# Patient Record
Sex: Male | Born: 1955 | Race: White | Hispanic: No | Marital: Married | Smoking: Never smoker
Health system: Southern US, Community
[De-identification: ages and names within clinical notes are randomized; demographics above are authoritative.]

## PROBLEM LIST (undated history)

## (undated) DIAGNOSIS — I10 Essential (primary) hypertension: Secondary | ICD-10-CM

## (undated) DIAGNOSIS — M1990 Unspecified osteoarthritis, unspecified site: Secondary | ICD-10-CM

## (undated) DIAGNOSIS — C801 Malignant (primary) neoplasm, unspecified: Secondary | ICD-10-CM

## (undated) DIAGNOSIS — Z9889 Other specified postprocedural states: Secondary | ICD-10-CM

## (undated) DIAGNOSIS — R112 Nausea with vomiting, unspecified: Secondary | ICD-10-CM

## (undated) HISTORY — PX: ROTATOR CUFF REPAIR: SHX139

## (undated) HISTORY — PX: OTHER SURGICAL HISTORY: SHX169

## (undated) HISTORY — PX: SEPTOPLASTY: SUR1290

---

## 2002-08-07 ENCOUNTER — Encounter: Payer: Self-pay | Admitting: Orthopedic Surgery

## 2002-08-07 ENCOUNTER — Ambulatory Visit (HOSPITAL_COMMUNITY): Admission: RE | Admit: 2002-08-07 | Discharge: 2002-08-07 | Payer: Self-pay | Admitting: Orthopedic Surgery

## 2002-09-19 ENCOUNTER — Ambulatory Visit (HOSPITAL_BASED_OUTPATIENT_CLINIC_OR_DEPARTMENT_OTHER): Admission: RE | Admit: 2002-09-19 | Discharge: 2002-09-20 | Payer: Self-pay | Admitting: Orthopedic Surgery

## 2004-03-04 ENCOUNTER — Ambulatory Visit (HOSPITAL_COMMUNITY): Admission: RE | Admit: 2004-03-04 | Discharge: 2004-03-04 | Payer: Self-pay | Admitting: Otolaryngology

## 2004-03-04 ENCOUNTER — Encounter (INDEPENDENT_AMBULATORY_CARE_PROVIDER_SITE_OTHER): Payer: Self-pay | Admitting: *Deleted

## 2004-03-04 ENCOUNTER — Ambulatory Visit (HOSPITAL_BASED_OUTPATIENT_CLINIC_OR_DEPARTMENT_OTHER): Admission: RE | Admit: 2004-03-04 | Discharge: 2004-03-04 | Payer: Self-pay | Admitting: Otolaryngology

## 2004-04-22 ENCOUNTER — Observation Stay (HOSPITAL_COMMUNITY): Admission: RE | Admit: 2004-04-22 | Discharge: 2004-04-23 | Payer: Self-pay | Admitting: Specialist

## 2005-07-11 ENCOUNTER — Ambulatory Visit (HOSPITAL_COMMUNITY): Admission: RE | Admit: 2005-07-11 | Discharge: 2005-07-11 | Payer: Self-pay | Admitting: Internal Medicine

## 2006-05-03 ENCOUNTER — Ambulatory Visit (HOSPITAL_COMMUNITY): Admission: RE | Admit: 2006-05-03 | Discharge: 2006-05-03 | Payer: Self-pay | Admitting: Specialist

## 2007-06-26 ENCOUNTER — Ambulatory Visit: Payer: Self-pay | Admitting: Cardiology

## 2007-06-26 ENCOUNTER — Ambulatory Visit (HOSPITAL_COMMUNITY): Admission: RE | Admit: 2007-06-26 | Discharge: 2007-06-26 | Payer: Self-pay | Admitting: Internal Medicine

## 2007-06-29 ENCOUNTER — Ambulatory Visit (HOSPITAL_COMMUNITY): Admission: RE | Admit: 2007-06-29 | Discharge: 2007-06-29 | Payer: Self-pay | Admitting: Internal Medicine

## 2007-09-12 IMAGING — CR DG CHEST 2V
2 series · 2 of 2 positions shown · non-contrast
Comparison: None.

CLINICAL DATA: Three week history of chest pain and shortness of breath.

CHEST - 2 VIEW  07/11/2005:

[view not recorded (1 of 2)]
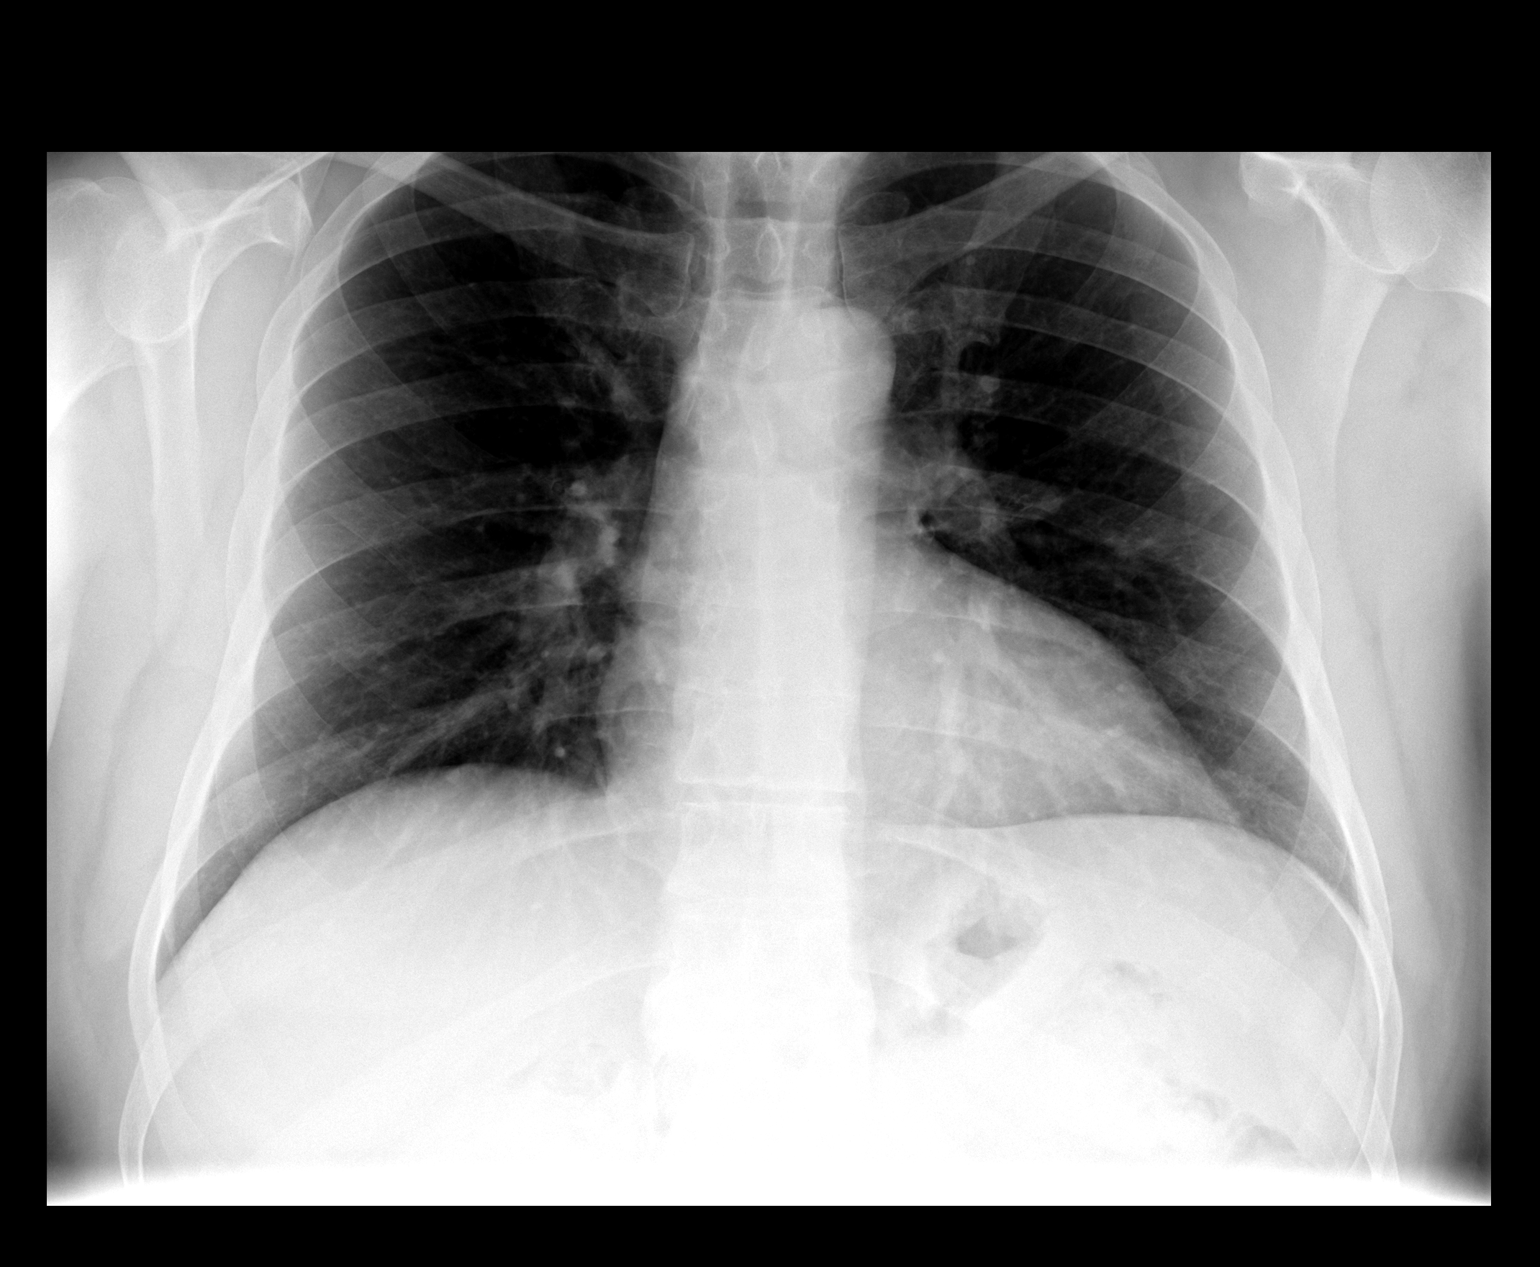

[view not recorded (2 of 2)]
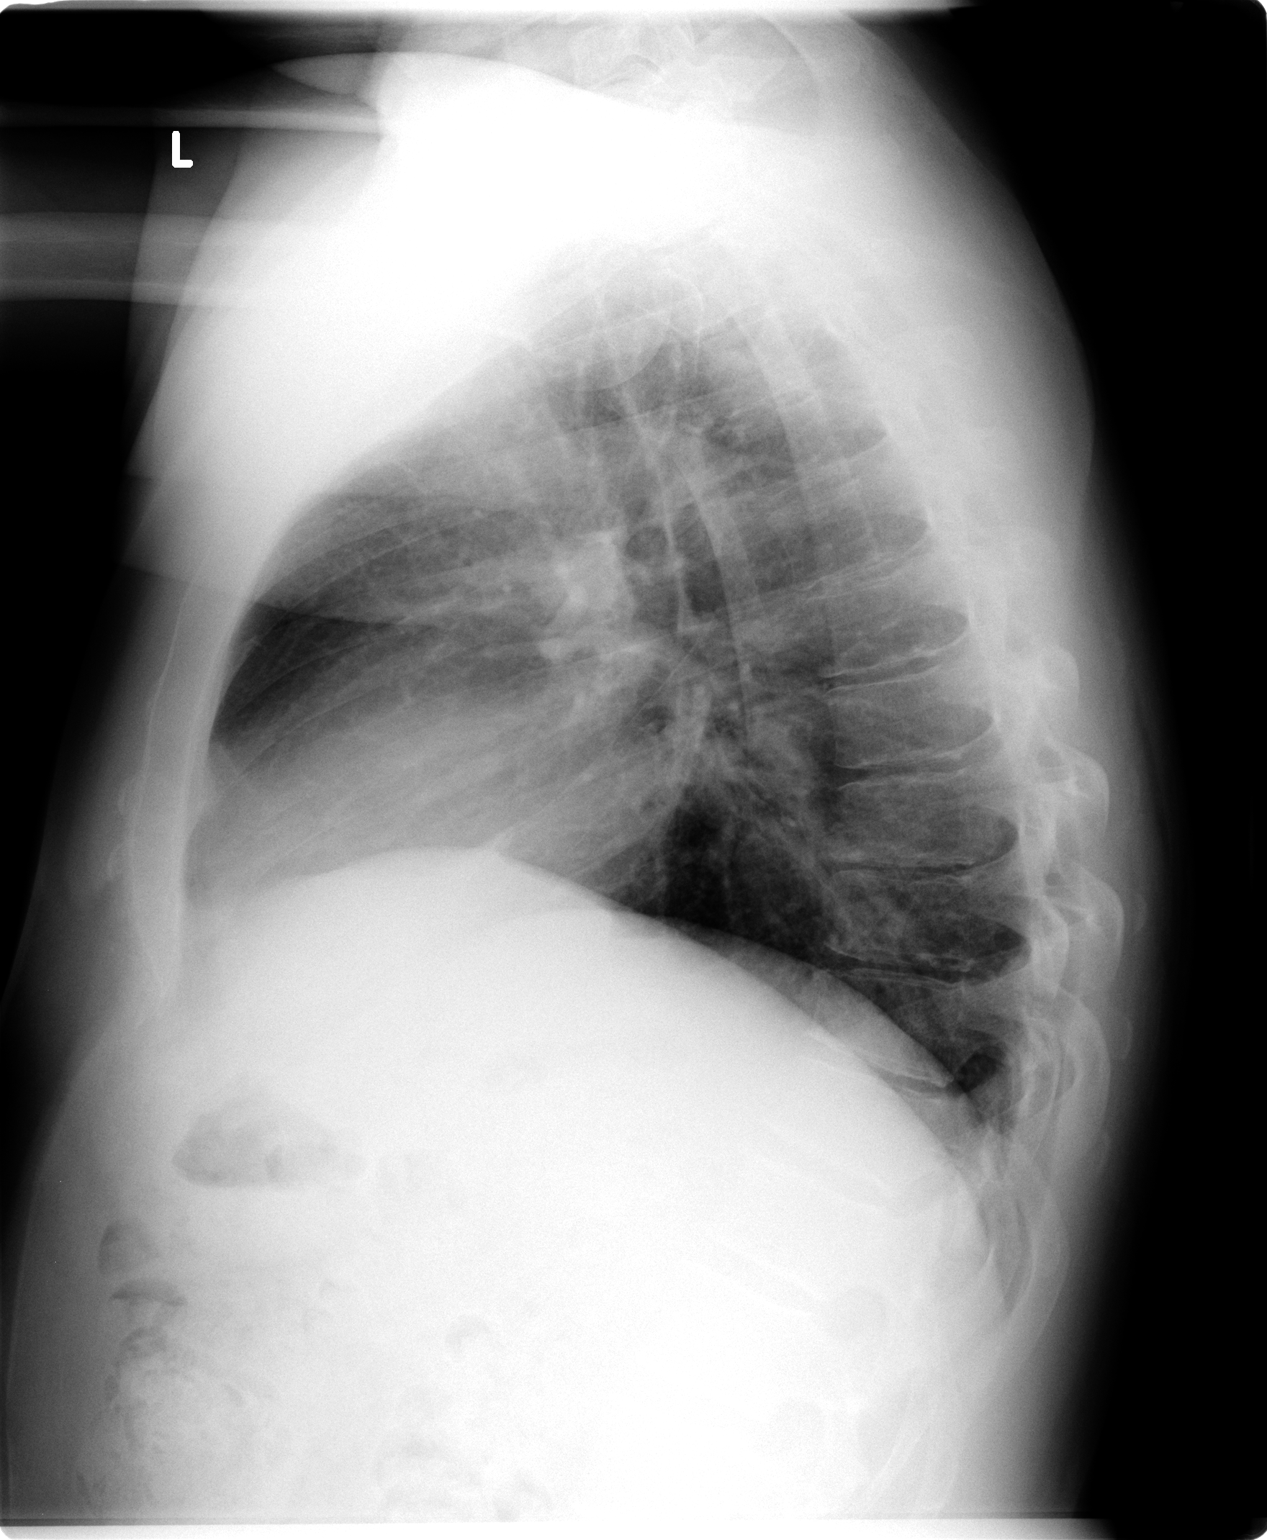

[2 of 2 positions shown; findings below may reference images not displayed]

FINDINGS: The cardiomediastinal silhouette is unremarkable. The lungs are
clear. There are no pleural effusions. Mild degenerative changes are present in
the thoracic spine.
IMPRESSION: No acute cardiopulmonary disease.

## 2008-01-22 ENCOUNTER — Ambulatory Visit (HOSPITAL_COMMUNITY): Admission: RE | Admit: 2008-01-22 | Discharge: 2008-01-22 | Payer: Self-pay | Admitting: Orthopaedic Surgery

## 2010-05-23 ENCOUNTER — Encounter: Payer: Self-pay | Admitting: Orthopaedic Surgery

## 2010-09-14 NOTE — Op Note (Signed)
NAME:  Derek Olson, Derek Olson               ACCOUNT NO.:  [id number]   MEDICAL RECORD NO.:  [id number]          PATIENT TYPE:  AMB   LOCATION:  DAY                           FACILITY:  [facility]   PHYSICIAN:  J. Darreld Mclean, M.D. DATE OF BIRTH:  01/31/1956   DATE OF PROCEDURE:  DATE OF DISCHARGE:                               OPERATIVE REPORT   PREOPERATIVE DIAGNOSIS:  Tear posterior horn of medial meniscus, right  knee.   POSTOPERATIVE DIAGNOSIS:  Tear posterior horn of medial meniscus, right  knee.   PROCEDURE:  Operative arthroscopy of the right knee, partial medial  meniscectomy.   ANESTHESIA:  General.   TOURNIQUET TIME:  16 minutes.   DRAINS:  None.   SURGEON:  J. Darreld Mclean, MD   INDICATION:  The patient is a 55 year old male with pain and tenderness  in his right knee.  In 2001, I did a partial meniscectomy at that time  of the medial side of the knee.  He has done well until this past year.  He is still having pain and tenderness.  I saw him in December.  MRI  showed tear of the posterior horn of the medial meniscus recurrent.  He  was working in [place] and his work schedule was such that he could not  have the surgery until now.  He has finished the work in [place].  He  did not want to have the surgery done there.  He has give way with the  knee.  He has had a fusion.   The patient was seen in the holding area.  He identified the right knee  is the correct surgical site.  He placed a mark on the right knee.  I  placed a mark on the right knee.  He was brought to the operating room.  He was placed supine.  He was given general anesthesia.  Leg holder and  tourniquet placed and deflated in the right upper thigh.  He was prepped  and draped in the usual manner.   Had a generalized time-out.  Identifying the patient Mr. Derek Olson and  the right knee is the correct surgical site.  All instrumentation was  deemed to be working and properly positioned.  The leg was  elevated,  wrapped circumferentially with an Esmarch bandage.  Inflow cannula  inserted medially, lactated Ringer's instilled to the knee by an  infusion pump.  Arthroscope inserted laterally and the knee was  systematically examined.  Permanent pictures were taken.   The suprapatellar pouch had some mild synovitis.  There was grade 2  changes on the surface of patella.  On the medial aspect he has grade 2  to 3 changes and tear in the posterior horn of the meniscus.  Loose  bodies present.  The fragment of the meniscus present.  Anterior  cruciate was intact.  Laterally, the meniscus looked very good.  Articular surfaces grade 2 and no tear.  No loose bodies or fragments.   The attention was directed back to the medial side.  Using meniscal  punch and meniscal shaver, a good smooth contour  was obtained.  Permanent pictures were taken.  Knee was systematically reexamined and  no new pathology found.  Knee was irrigated with remainder part of  lactated Ringer's.  Wounds were reapproximated using 3-0 nylon  interrupted vertical mattress manner.  Marcaine 0.25% instilled in each  portal.  Tourniquet deflated after 16 minutes.  The patient tolerated  the procedure well.  Prescription given for Ultram for pain.  I will see  him in the office in approximately 10 days to 2 weeks.  Physical therapy  has been arranged.  If any difficulties, contact me through the office  or hospital beeper system.           ______________________________  Shela Commons. Darreld Mclean, M.D.     JWK/MEDQ  D:  01/22/2008  T:  01/23/2008  Job:  [id number]

## 2010-09-14 NOTE — H&P (Signed)
NAME:  Derek Olson, Derek Olson               ACCOUNT NO.:  [id number]   MEDICAL RECORD NO.:  [id number]          PATIENT TYPE:  AMB   LOCATION:  DAY                           FACILITY:  [facility]   PHYSICIAN:  J. Darreld Mclean, M.D. DATE OF BIRTH:  10/16/1955   DATE OF ADMISSION:  DATE OF DISCHARGE:  LH                              HISTORY & PHYSICAL   CHIEF COMPLAINT:  Right knee pain.   HISTORY:  The patient is a 55 year old male who complains of pain and  tenderness in his right knee that has been going on for a few months.  I  did surgery on his right knee on January 11, 2000.  He has done well  until just recently.  He has had giving way of the knee and it was  getting worse.  I saw him in the office on March 27, 2007 and we had  an MRI done.  He was status post medial meniscectomy with a recurrent  tear of the posterior horn of the medial meniscus.  I recommended  surgery at that time, but he wanted to wait until later in the year.  I  saw him back on December 20, 2007 and he wanted to go ahead and  proceed  with surgery on January 22, 2008 at his request.  He was having more  giving way and more popping.  The patient has been working in [place]  and has been out of town as one of the reasons he wanted to delay his  surgery as long as he has.  He is now back in town.  He understands both  the risks and imponderables of the procedure, and had arthroscopy of the  same knee back in 2001.   PAST MEDICAL HISTORY:  1. Positive for surgery on the right knee by me 2001.  2. Right shoulder March 2000.  3. Left shoulder 2009 by Dr. Shelle Iron in [place].   ALLERGIES:  HYDROCODONE.   MEDICATIONS:  1. Allegra D.  2. Ambien.   SOCIAL HISTORY:  He does not smoke or use alcohol.  The patient lives in  [place] and is divorced.  His job carries him out of state quite often.   FAMILY HISTORY:  He denies any diseases that run in the family.   FAMILY DOCTOR:  Kingsley Callander. Ouida Sills, MD.   PHYSICAL  EXAMINATION:  VITAL SIGNS:  BP 120/78, pulse 76, respiration  20, afebrile, 5 feet 8-1/2 inches, 208 pounds.  GENERAL:  The patient is alert, cooperative and oriented.  HEENT:  Negative.  NECK:  Supple.  LUNGS:  Clear to P&A.  HEART:  Regular without murmur heard.  ABDOMEN:  Soft, nontender and without masses.  EXTREMITIES:  Right knee, he has got some mild pain, tenderness,  effusion, mild crepitus.  Range of motion is good from 0-100 degrees.  The knee is stable with a positive McMurray medially.  There is no  distal edema.  NEUROVASCULAR:  Intact.  CNS:  Intact.  SKIN:  Intact.   IMPRESSION:  Tear right knee medial meniscus, recurrent.   PLAN:  Arthroscopy of  the right knee.  The patient understands the  procedure, the risk and imponderables.  Labs are pending.                                            ______________________________  J. Darreld Mclean, M.D.     JWK/MEDQ  D:  01/21/2008  T:  01/21/2008  Job:  [id number]

## 2010-09-14 NOTE — Procedures (Signed)
NAME:  Derek Olson, Derek Olson               ACCOUNT NO.:  [id number]   MEDICAL RECORD NO.:  [id number]          PATIENT TYPE:  OUT   LOCATION:  RAD                           FACILITY:  [facility]   PHYSICIAN:  Kingsley Callander. Ouida Sills, MD       DATE OF BIRTH:  13-Mar-1956   DATE OF PROCEDURE:  06/29/2007  DATE OF DISCHARGE:                                  STRESS TEST   The patient underwent exercise stress test to evaluate recent symptoms  of atypical chest pain. He exercised 10 minutes (1 minute into stage IV  of the Bruce protocol) obtaining a maximal heart rate of 171 (100% of  the age predicted maximal heart rate) at a workload of 12.8 mets and  discontinued exercise due to fatigue.  There were no symptoms of chest  pain.  There were no arrhythmias.  There were no ST-segment changes  diagnostic of ischemia.  The baseline electrocardiogram revealed normal  sinus rhythm at 75 beats per minute.   IMPRESSION:  No evidence of exercise-induced ischemia.      Kingsley Callander. Ouida Sills, MD  Electronically Signed     ROF/MEDQ  D:  06/29/2007  T:  06/29/2007  Job:  [id number]

## 2010-09-14 NOTE — Procedures (Signed)
NAME:  Derek Olson, Derek Olson               ACCOUNT NO.:  06/27/2007   MEDICAL RECORD NO.:  [id number]          PATIENT TYPE:  OUT   LOCATION:  RAD                           FACILITY:  [facility]   PHYSICIAN:  Gerrit Friends. Dietrich Pates, MD, FACCDATE OF BIRTH:  1955-10-27   DATE OF PROCEDURE:  06/26/2007  DATE OF DISCHARGE:                                ECHOCARDIOGRAM   REFERRING PHYSICIAN:  Kingsley Callander. Ouida Sills, MD   CLINICAL DATA:  A 55 year old gentleman with mitral regurgitation.   NOTE:  The doctor says Cancel this dictation.      Gerrit Friends. Dietrich Pates, MD, [facility]     RMR/MEDQ  D:  06/26/2007  T:  06/27/2007  Job:  [id number]

## 2010-09-17 NOTE — Op Note (Signed)
NAME:  Derek Olson, Derek Olson               ACCOUNT NO.:  [id number]   MEDICAL RECORD NO.:  [id number]          PATIENT TYPE:  AMB   LOCATION:  DAY                          FACILITY:  [facility]   PHYSICIAN:  Jene Every, M.D.    DATE OF BIRTH:  22-Oct-1955   DATE OF PROCEDURE:  DATE OF DISCHARGE:                               OPERATIVE REPORT   PREOPERATIVE DIAGNOSES:  1. Preoperative impingement syndrome.  2. Rotator cuff arthropathy.   POSTOPERATIVE DIAGNOSES:  1. Preoperative impingement syndrome.  2. Rotator cuff arthropathy.  3. Degenerative tearing of the rotator cuff.   PROCEDURE PERFORMED:  1. Left shoulder arthroscopy.  2. Exam under anesthesia.  3. Subacromial decompression.  4. Acromioplasty.  5. Bursectomy.  6. Debridement of degenerative tearing of the rotator cuff.   ASSISTANT:  Roma Schanz, P.A.   BRIEF HISTORY AND INDICATIONS:  This is a 55 year old gentleman who has  had repair of a rotator cuff tendon in the past with some minor residual  symptoms that warrant at this time, a gradual re-aggravation with  activities.  MRI indicating tendinitis and bursitis, but no evidence of  a rotator cuff tear.  Persistent impingement type pain refractory to  injections and conservative treatment, and is indicated for arthroscopic  evaluation to evaluate for an occult tear, and to perform a debridement.  Risks and benefits discussed including bleeding, infection, no change in  symptoms or worsening of symptoms, need for repeat debridement or open  rotator cuff repair.   TECHNIQUE:  The patient in supine beach chair position.  After the  induction of adequate general anesthesia and 1 gram of Kefzol, the left  shoulder and upper extremity was prepped and draped in the usual sterile  fashion.  A surgical marker was utilized to delineate the acromion and  the [facility] joint.  A standard posterolateral portal was utilized with the  incision made through the skin only.  With the arm in  the 70/30  position, we advanced the camera to the glenohumeral joint penetrating  it atraumatically in line with the coracoid.  Irrigant was utilized to  insufflate the joint.  Inspection revealed mild fraying of the  undersurface of the rotator cuff, but no tear, there was full  attachment.  We stressed with internal and external rotation, and  traction.  Again, no tear was noted here.  Subscapularis was  unremarkable.  Biceps, labral, complex was unremarkable.  No evidence of  slap tear.  Mild degenerative changes of the joint were noted.  Copiously lavaged.  I re-inserted the camera in the subacromial space  and fascia in the lateral portal through the skin only and advanced the  cannula by triangulating.  Hypertrophic bursitis was noted, this was  excised with some fibrosis noted from the created surgical rotator cuff  procedure.  This was debrided.  There was a small spur on the anterior  aspect of the acromion towards the [facility] joint with a flap of fibrotic  tissue seemingly impinging inferiorly that was debrided and  acromioplasty of this small spur was performed.  We then went full  external and  internal rotation after a bursectomy with some degenerative  fraying of the cuff.  I mostly debrided with a shaver to good bleeding  tissue.  No full thickness tear or significant partial tear was noted.  After that, the joint was copiously lavaged, all instrumentation was  removed.  The portals were closed with 1 nylon simple suture.  0.25%  Marcaine with epinephrine infiltrated in the joint.  The wound was  dressed sterilely.  He was awakened without difficulty and transported  to the recovery room in satisfactory condition.   The patient tolerated the procedure well.  No complications.      Jene Every, M.D.  Electronically Signed     JB/MEDQ  D:  05/03/2006  T:  05/03/2006  Job:  [id number]

## 2010-09-17 NOTE — Op Note (Signed)
[name], Derek Olson               ACCOUNT NO.:  [id number]   MEDICAL RECORD NO.:  [id number]          PATIENT TYPE:  AMB   LOCATION:  DSC                          FACILITY:  [facility]   PHYSICIAN:  Suzanna Obey, M.D.       DATE OF BIRTH:  1955/09/13   DATE OF PROCEDURE:  03/04/2004  DATE OF DISCHARGE:                                 OPERATIVE REPORT   PREOPERATIVE DIAGNOSIS:  Left nasal basal cell carcinoma.   POSTOPERATIVE DIAGNOSIS:  Left nasal basal cell carcinoma.   PROCEDURE:  Excision of left basal cell carcinoma with frozen section  diagnosis and then a bilobed rotation flap reconstruction.   ANESTHESIA:  General endotracheal tube anesthesia.   ESTIMATED BLOOD LOSS:  10 mL.   INDICATIONS FOR PROCEDURE:  This is a 55 year old who has had a long history  of a lesion on the left nose which has been repetitively biopsied and  removed by dermatology.  Diagnosis had been basal cell carcinoma.  He  continues to have a crusted bloody area that now needs to be formally  removed. He was informed of the risks and benefits of the procedure  including bleeding, infection, scarring, knotting of his ala, scar with skin  discoloration or mismatch, and risk of the anesthetic.  All questions were  answered and consent was obtained.   DESCRIPTION OF PROCEDURE:  The patient was taken to the operating room and  placed in supine position after adequate general endotracheal tube  anesthesia, was prepped and draped in the usual sterile manner.  He was  injected with 1% lidocaine with 1:100,000 epinephrine and then a circular  incision was made around the crusted area which was sent for frozen section.  Margins were clear except for the inferior aspect which was then reexcised  closer to the rim.  It still had a 3 to 4 mm rim of skin along the rim.  This margin was negative.  The flap was then fashioned with a bilobed flap  created at 90 degrees rotated up into the superior aspect of the nose.  A  back cut Burow triangle was made off the inferior aspect of the circular  defect.  The flap was elevated circumferentially and then rotated into the  defect.  It was secured with 4-0 chromic and then a 5-0 nylon.  This closed  the defect very nicely.  There did not  appear to be any notching or pulling on the alar rim.  Multiple sutures  closed all the areas in interrupted fashion.  The Bacitracin was placed and  a cotton ball with Steri-Strips placed over it for bolster dressing was  placed.  The patient was awakened, brought to the recovery room in stable  condition.  Counts correct.       JB/MEDQ  D:  03/04/2004  T:  03/04/2004  Job:  [id number]   cc:   Kingsley Callander. Ouida Sills, MD  [address]  [place]  [place] [address]  Fax: [phone]

## 2010-09-17 NOTE — Op Note (Signed)
NAME:  Derek Olson, Derek Olson                         ACCOUNT NO.:  [id number]   MEDICAL RECORD NO.:  [id number]                   PATIENT TYPE:  AMB   LOCATION:  DSC                                  FACILITY:  [facility]   PHYSICIAN:  Katy Fitch. Naaman Plummer., M.D.          DATE OF BIRTH:  22-Dec-1955   DATE OF PROCEDURE:  09/19/2002  DATE OF DISCHARGE:                                 OPERATIVE REPORT   PREOPERATIVE DIAGNOSES:  1. 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  2. Status post arthroscopic subacromial decompression with clinical evidence     of acromioclavicular arthropathy and MRI documentation of an area of     significant deep surface tendinopathy of the supraspinatus tendon.   POSTOPERATIVE DIAGNOSES:  1. 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  2. Status post arthroscopic subacromial decompression with clinical evidence     of acromioclavicular arthropathy and MRI documentation of an area of     significant deep surface tendinopathy of the supraspinatus tendon.  3. Identification of significant acromioclavicular arthropathy with a     prominent inferior acromioclavicular spur and some recurrent anterior     acromial spurring due to calcification of the coracoacromial ligament,     which had reconstituted.  4. As well as some bursal side tendinosis of the supraspinatus tendon.   OPERATION:  1. Examination of right glenohumeral joint under anesthesia, followed by     diagnostic arthroscopy with debridement of type 1 SLAP lesion.  2. Subacromial decompression with anterior acromioplasty and resection of     medial aspect of acromioclavicular joint with bursectomy and debridement     of bursal surface of supraspinatus and infraspinatus tendons.  3.  Open resection of distal clavicle with repair of deltoid trapezius     muscles, i.e. Mumford procedure through a separate open incision.   OPERATING SURGEON:  Katy Fitch. Sypher, M.D.   ASSISTANT:  Derek Olson, P.A.   ANESTHESIA:  General orotracheal.   SUPERVISING ANESTHESIOLOGIST:  Derek Olson, M.D.   INDICATIONS FOR PROCEDURE:  Derek Olson is a 55 year old gentleman  employed as a Environmental manager.   In 2002, he was referred for evaluation of a spinoglenoid notch ganglion  with infraspinatus atrophy and a labral tear with signs of chronic shoulder  impingement.   He underwent a staged procedure, during which he had open resection of his  spinoglenoid notch ganglion, decompressing the suprascapular nerve, followed  by arthroscopic debridement of his labrum, arthroscopic repair of his  anterior labral type 2 SLAP lesion and arthroscopic acromioplasty.   He returned to work and did well for approximately 2 years.  He then  developed a  current shoulder pain.  Clinical examination reveals signs of AC  arthropathy and possible rotator cuff pathology.   He was referred for an MRI, which revealed a bursal side tear of a small  section of his supraspinatus.  He had a recurrent anterior spur and was  developing an enlargement of his AC joint with an inferior projection of the  medial aspect of the acromion and distal clavicle.   Due to a failure to respond to non-operative measures, we have recommended  proceeding with diagnostic arthroscopy at this time, anticipating  arthroscopic glenohumeral evaluation, probable debridement of a deep surface  rotator cuff tear, subacromial decompression, resection of the distal  clavicle and possible open repair of the supraspinatus.   After informed consent, Derek Olson is brought to the operating room at  this time.   We had initially advised him to expect to be out of work for 3 months  following this procedure.    Questions were invited and answered.   He was noted to be intolerant of hydrocodone.   PROCEDURE:  Derek Olson was brought to the operating room and placed in  supine position on the operating table.   Following induction of general anesthesia, he was carefully positioned in  the beach chair position with __________ torso and head holder designed for  shoulder arthroscopy.   The entire right upper extremity and four-quarter are prepped with Duraprep  and draped with impervious arthroscopy drapes.   The arthroscope was introduced with blunt technique after distention of the  shoulder with 10 cc of sterile saline.  Diagnostic arthroscopy revealed  intact chondral articular surfaces on the glenoid and humeral head.  The  biceps tendon was pristine.  The superior labrum was attached securely  anteriorly.  There was a variant of the superior glenohumeral ligament that  was noted to be otherwise stable.  His middle and inferior glenohumeral  ligaments anteriorly were intact and stable.  The labrum from approximately  one o'clock anteriorly to eleven o'clock posteriorly had fraying of the rim,  but was stable and not falling within the joint.  There was no sign of a  traditional type 2 SLAP lesion.   An anterior portal was created with a blunt trocar, followed by the use of a  4.5 mm suction shaver to debride the free margin of the labrum to a smooth  rim.   A nerve hook was used to check the stability of the labrum and the biceps  anchor and stability was noted.   The arthroscopic equipment was removed, followed by placement of the scope  in the subacromial space.   There was noted to be a smooth bursa throughout the space.  There was a  significant degree of fraying on the superior surface of the supraspinatus  tendon, suggesting a low-grade impingement.  The medial clavicle was quite prominent.  There is considerable scarring in the region of the former  coracoacromial ligament  and some calcification of the ligament creating an  anterior low point that was quite firm and probably causing some degree of  impingement.   With the scope visualizing, initially, posteriorly and then laterally, the  acromion was leveled to a type 1 morphology with use of a suction bur.  The  medial spur at the [facility] joint was removed from the acromial side, followed by  removal of the inferior capsule.   The bursal side of the tendon was carefully inspected and found to have no  signs of a  full-thickness tear.  The deep surface had been carefully  inspected and while some tendinosis was noted at the supraspinatus,  approximately 3 mm behind the biceps tendon, there was no sign of retracted  cuff tear; therefore, actual repair of the cuff was deferred.   It appeared that the cuff lesion was more of the nature of a tendinopathy or  a tendinosis, similar to the tennis elbow lesion.   In my judgment, decompression of this was probably an adequate remedy.   Attention was then directed to the distal clavicle.   A longitudinal incision was fashioned over the distal clavicle over a length  of 2 cm.  Subcutaneous tissues were carefully divided revealing the capsule.  The periosteum at the distal clavicle was elevated with an osteotome and  baby Bennett retractors placed.  An oscillating saw was used to remove the  distal 18 mm of clavicle and a rongeur was used to smooth the margins.   The dead space created was closed by closure of the anterior deltoid to the  trapezius muscle with mattress sutures of #2 __________.   The wound was then irrigated and repaired with subdermal sutures of 3-0  Vicryl and intradermal 3-0 Prolene and Steri-strip.   There were no apparent complications.   Derek Olson tolerated the surgery and anesthesia well.  He was transferred  to the recovery room with stable signs.   He will be discharged home to the care of his family.  We will see him back  in the  office in 24 hours to begin an exercise program and to explain our  pathologic findings to him.   We anticipate that he will be out of work approximately 6-8 weeks while we  recover strength following his decompression and distal clavicle resection.                                               Katy Fitch Naaman Plummer., M.D.    RVS/MEDQ  D:  09/19/2002  T:  09/19/2002  Job:  [id number]   cc:   Anesthesia

## 2010-09-17 NOTE — Op Note (Signed)
NAME:  Derek Olson, FURCHES NO.:  [id number]   MEDICAL RECORD NO.:  [id number]          PATIENT TYPE:  OBV   LOCATION:  0443                         FACILITY:  [facility]   PHYSICIAN:  Jene Every, M.D.    DATE OF BIRTH:  Jan 14, 1956   DATE OF PROCEDURE:  04/22/2004  DATE OF DISCHARGE:                                 OPERATIVE REPORT   PREOPERATIVE DIAGNOSIS:  Impingement syndrome, rotator cuff tear, left  shoulder.   POSTOPERATIVE DIAGNOSIS:  Impingement syndrome, rotator cuff tear, left  shoulder.   PROCEDURE:  Mini open rotator cuff repair, subacromial decompression,  acromioplasty.   ANESTHESIA:  General.   SURGEON:  Jene Every, M.D.   ASSISTANT:  Roma Schanz, P.A.   INDICATIONS FOR PROCEDURE:  This is a 55 year old with refractory shoulder  pain secondary to progressive rotator cuff tear.  He has failed conservative  treatment.  Operative intervention was indicated for decompression of the  rotator cuff followed by repair.  The risks and benefits have been  discussed, including bleeding, infection, neurovascular structures,  suboptimal range of motion, recurrent tear, adhesive capsulitis.   DESCRIPTION OF PROCEDURE:  The patient in supine position, after adequate  induction of general anesthesia, 1 g Kefzol, the left shoulder and upper  extremity prepped and draped in the usual sterile fashion.   A 2-cm incision was made in the anterior aspect of the acromion in Langer's  lines.  The subcutaneous tissue was dissected with Bovie electrocautery to  achieve hemostasis.  The raphe between the anterolateral head of the deltoid  was identified and divided with a subperiosteal elevator from the anterior  aspect of the acromion.  The CA ligament was divided and excised.  An  anterolateral spur was noted on the acromion, and with the soft tissues well-  protected, this was removed with a Beyer rongeur and contoured with a high-  speed bur.  Following this,  inspection of the subacromial space revealed  florid hypertrophic bursa, abnormal bursal tissue.  This was excised.  I  digitally lysed adhesions in the subacromial space and in the subdeltoid  bursa.  Following this, I inspected the rotator cuff.  There was a  hypervascular area with a near full-thickness tear on the anterolateral  aspect of the acromion at the insertion of the supraspinatus.  This was  excised up to a centimeter in length, and good bleeding tissue was then  reapproximated with #1 Vicryl figure-of-eight sutures.  It was not detached  and retracted.  There was no need for a suture anchor.  Following this  repair, we had full range without impingement.  The wound was copiously  irrigated with antibiotic irrigation.  The raphe was then repaired with #1  Vicryl figure-of-eight sutures.  The subcutaneous tissue was reapproximated  with 2-0 Vicryl subcuticular sutures.  The skin was reapproximated with 4-0  subcuticular chromic, __________ 4 Steri-Strips and sterile dressing  applied.  He was placed in an abduction pillow.   The patient was extubated without difficulty and was transported to the  recovery room in satisfactory condition.  The patient  tolerated the  procedure well with no complications.     [name]   JB/MEDQ  D:  04/22/2004  T:  04/22/2004  Job:  [id number]

## 2010-09-28 ENCOUNTER — Ambulatory Visit (INDEPENDENT_AMBULATORY_CARE_PROVIDER_SITE_OTHER): Payer: Managed Care, Other (non HMO) | Admitting: Urology

## 2010-09-28 DIAGNOSIS — N509 Disorder of male genital organs, unspecified: Secondary | ICD-10-CM

## 2011-01-31 LAB — COMPREHENSIVE METABOLIC PANEL
ALT: 23
AST: 22
Albumin: 3.9
Alkaline Phosphatase: 37 — ABNORMAL LOW
BUN: 8
CO2: 32
Calcium: 9.3
Chloride: 104
Creatinine, Ser: 0.66
GFR calc Af Amer: >60
GFR calc non Af Amer: >60
Glucose, Bld: 72
Potassium: 4.4
Sodium: 140
Total Bilirubin: 0.9
Total Protein: 6.4

## 2011-01-31 LAB — CBC
HCT: 43.5
Hemoglobin: 15.1
MCHC: 34.7
MCV: 94.2
Platelets: 201
RBC: 4.61
RDW: 12.4
WBC: 5.7

## 2011-01-31 LAB — DIFFERENTIAL
Basophils Absolute: 0
Basophils Relative: 0
Eosinophils Absolute: 0.1
Eosinophils Relative: 2
Lymphocytes Relative: 28
Lymphs Abs: 1.6
Monocytes Absolute: 0.7
Monocytes Relative: 12
Neutro Abs: 3.3
Neutrophils Relative %: 58

## 2011-01-31 LAB — URINALYSIS, ROUTINE W REFLEX MICROSCOPIC
Bilirubin Urine: NEGATIVE
Glucose, UA: NEGATIVE
Hgb urine dipstick: NEGATIVE
Ketones, ur: NEGATIVE
Nitrite: NEGATIVE
Protein, ur: NEGATIVE
Specific Gravity, Urine: <1.005 — ABNORMAL LOW
Urobilinogen, UA: 0.2
pH: 6

## 2014-04-01 ENCOUNTER — Other Ambulatory Visit: Payer: Self-pay | Admitting: Orthopaedic Surgery

## 2014-04-03 NOTE — Addendum Note (Signed)
Addended by: Sanjuana Kava on: 04/03/2014 09:36 AM   Modules accepted: Orders

## 2014-04-04 ENCOUNTER — Encounter (HOSPITAL_COMMUNITY): Payer: Self-pay

## 2014-04-04 ENCOUNTER — Encounter (HOSPITAL_COMMUNITY)
Admission: RE | Admit: 2014-04-04 | Discharge: 2014-04-04 | Disposition: A | Discharge status: Home / Self Care | Payer: Managed Care, Other (non HMO) | Source: Ambulatory Visit | Attending: Orthopaedic Surgery | Admitting: Orthopaedic Surgery

## 2014-04-04 DIAGNOSIS — Z01818 Encounter for other preprocedural examination: Secondary | ICD-10-CM | POA: Diagnosis present

## 2014-04-04 HISTORY — DX: Unspecified osteoarthritis, unspecified site: M19.90

## 2014-04-04 HISTORY — DX: Essential (primary) hypertension: I10

## 2014-04-04 LAB — CBC WITH DIFFERENTIAL/PLATELET
Basophils Absolute: 0 K/uL (ref 0.0–0.1)
Basophils Relative: 1 % (ref 0–1)
Eosinophils Absolute: 0.1 K/uL (ref 0.0–0.7)
Eosinophils Relative: 2 % (ref 0–5)
HCT: 36.6 % — ABNORMAL LOW (ref 39.0–52.0)
Hemoglobin: 13.3 g/dL (ref 13.0–17.0)
Lymphocytes Relative: 32 % (ref 12–46)
Lymphs Abs: 2.3 K/uL (ref 0.7–4.0)
MCH: 32.5 pg (ref 26.0–34.0)
MCHC: 36.3 g/dL — ABNORMAL HIGH (ref 30.0–36.0)
MCV: 89.5 fL (ref 78.0–100.0)
Monocytes Absolute: 0.7 K/uL (ref 0.1–1.0)
Monocytes Relative: 9 % (ref 3–12)
Neutro Abs: 4.1 K/uL (ref 1.7–7.7)
Neutrophils Relative %: 56 % (ref 43–77)
Platelets: 183 K/uL (ref 150–400)
RBC: 4.09 MIL/uL — ABNORMAL LOW (ref 4.22–5.81)
RDW: 12.9 % (ref 11.5–15.5)
WBC: 7.3 K/uL (ref 4.0–10.5)

## 2014-04-04 LAB — COMPREHENSIVE METABOLIC PANEL
ALT: 16 U/L (ref 0–53)
AST: 18 U/L (ref 0–37)
Albumin: 3.9 g/dL (ref 3.5–5.2)
Alkaline Phosphatase: 65 U/L (ref 39–117)
Anion gap: 11 (ref 5–15)
BUN: 15 mg/dL (ref 6–23)
CO2: 29 mEq/L (ref 19–32)
Calcium: 9.5 mg/dL (ref 8.4–10.5)
Chloride: 103 mEq/L (ref 96–112)
Creatinine, Ser: 0.76 mg/dL (ref 0.50–1.35)
GFR calc Af Amer: >90 mL/min (ref >90)
GFR calc non Af Amer: >90 mL/min (ref >90)
Glucose, Bld: 97 mg/dL (ref 70–99)
Potassium: 4.4 mEq/L (ref 3.7–5.3)
Sodium: 143 mEq/L (ref 137–147)
Total Bilirubin: 0.6 mg/dL (ref 0.3–1.2)
Total Protein: 6.9 g/dL (ref 6.0–8.3)

## 2014-04-04 LAB — URINALYSIS, ROUTINE W REFLEX MICROSCOPIC
Bilirubin Urine: NEGATIVE
Glucose, UA: NEGATIVE mg/dL
HGB URINE DIPSTICK: NEGATIVE
Ketones, ur: NEGATIVE mg/dL
Leukocytes, UA: NEGATIVE
NITRITE: NEGATIVE
PROTEIN: NEGATIVE mg/dL
Specific Gravity, Urine: 1.01 (ref 1.005–1.030)
Urobilinogen, UA: 0.2 mg/dL (ref 0.0–1.0)
pH: 7.5 (ref 5.0–8.0)

## 2014-04-04 NOTE — Patient Instructions (Signed)
Derek Olson  04/04/2014   Your procedure is scheduled on:  04/08/2014  Report to [facility] at  70  AM.  Call this number if you have problems the morning of surgery: [phone]   Remember:   Do not eat food or drink liquids after midnight.   Take these medicines the morning of surgery with A SIP OF WATER: benicar   Do not wear jewelry, make-up or nail polish.  Do not wear lotions, powders, or perfumes.   Do not shave 48 hours prior to surgery. Men may shave face and neck.  Do not bring valuables to the hospital.  [facility] is not responsible for any belongings or valuables.               Contacts, dentures or bridgework may not be worn into surgery.  Leave suitcase in the car. After surgery it may be brought to your room.  For patients admitted to the hospital, discharge time is determined by your treatment team.               Patients discharged the day of surgery will not be allowed to drive home.  Name and phone number of your driver: family  Special Instructions: Shower using CHG 2 nights before surgery and the night before surgery.  If you shower the day of surgery use CHG.  Use special wash - you have one bottle of CHG for all showers.  You should use approximately 1/3 of the bottle for each shower.   Please read over the following fact sheets that you were given: Pain Booklet, Coughing and Deep Breathing, Surgical Site Infection Prevention, Anesthesia Post-op Instructions and Care and Recovery After Surgery Arthroscopic Procedure, Knee An arthroscopic procedure can find what is wrong with your knee. PROCEDURE Arthroscopy is a surgical technique that allows your orthopedic surgeon to diagnose and treat your knee injury with accuracy. They will look into your knee through a small instrument. This is almost like a small (pencil sized) telescope. Because arthroscopy affects your knee less than open knee surgery, you can anticipate a more rapid recovery. Taking an  active role by following your caregiver's instructions will help with rapid and complete recovery. Use crutches, rest, elevation, ice, and knee exercises as instructed. The length of recovery depends on various factors including type of injury, age, physical condition, medical conditions, and your rehabilitation. Your knee is the joint between the large bones (femur and tibia) in your leg. Cartilage covers these bone ends which are smooth and slippery and allow your knee to bend and move smoothly. Two menisci, thick, semi-lunar shaped pads of cartilage which form a rim inside the joint, help absorb shock and stabilize your knee. Ligaments bind the bones together and support your knee joint. Muscles move the joint, help support your knee, and take stress off the joint itself. Because of this all programs and physical therapy to rehabilitate an injured or repaired knee require rebuilding and strengthening your muscles. AFTER THE PROCEDURE  After the procedure, you will be moved to a recovery area until most of the effects of the medication have worn off. Your caregiver will discuss the test results with you.  Only take over-the-counter or prescription medicines for pain, discomfort, or fever as directed by your caregiver. SEEK MEDICAL CARE IF:   You have increased bleeding from your wounds.  You see redness, swelling, or have increasing pain in your wounds.  You have pus coming from your wound.  You have an oral temperature above 102 F (38.9 C).  You notice a bad smell coming from the wound or dressing.  You have severe pain with any motion of your knee. SEEK IMMEDIATE MEDICAL CARE IF:   You develop a rash.  You have difficulty breathing.  You have any allergic problems. Document Released: 04/15/2000 Document Revised: 07/11/2011 Document Reviewed: 11/07/2007 [facility] Patient Information 2015 [place], [place]. This information is not intended to replace advice given to you by your health  care provider. Make sure you discuss any questions you have with your health care provider. PATIENT INSTRUCTIONS POST-ANESTHESIA  IMMEDIATELY FOLLOWING SURGERY:  Do not drive or operate machinery for the first twenty four hours after surgery.  Do not make any important decisions for twenty four hours after surgery or while taking narcotic pain medications or sedatives.  If you develop intractable nausea and vomiting or a severe headache please notify your doctor immediately.  FOLLOW-UP:  Please make an appointment with your surgeon as instructed. You do not need to follow up with anesthesia unless specifically instructed to do so.  WOUND CARE INSTRUCTIONS (if applicable):  Keep a dry clean dressing on the anesthesia/puncture wound site if there is drainage.  Once the wound has quit draining you may leave it open to air.  Generally you should leave the bandage intact for twenty four hours unless there is drainage.  If the epidural site drains for more than 36-48 hours please call the anesthesia department.  QUESTIONS?:  Please feel free to call your physician or the hospital operator if you have any questions, and they will be happy to assist you.

## 2014-04-04 NOTE — Pre-Procedure Instructions (Signed)
Patient given information to sign up for my chart at home. 

## 2014-04-07 NOTE — H&P (Signed)
Derek Olson is an 58 y.o. male.   Chief Complaint: Right knee pain HPI: He has had right knee pain for about two to three months.  He has swelling and popping and giving way.  He has had prior arthroscopy of the right knee by me in 2009.  He said his knee felt like it did back then.  He had a MRI of the right knee done on 02/11/14 in [place] showing tricompartmental degenerative joint disease as well as a degenerative tear of the remaining medial meniscus of the right knee.  He was advised of the findings when I saw him in the office on 03/07/14.  He wanted to hold off any surgery until after Thanksgiving.  He returned to the office on 03/31/14 and asked to schedule the surgery for December 8th.  He is aware of the risks and imponderables having had such a surgery before.  He has asked appropriate questions.  I have told him he has more arthritis now than before and eventually he might be a candidate for a total knee.  He appeared to understand.  Past Medical History  Diagnosis Date  . Hypertension   . Arthritis     Past Surgical History  Procedure Laterality Date  . Septoplasty    . Rotator cuff repair Right     x3  . Rotator cuff repair Left   . Arthroscopy Right     x2    No family history on file. Social History:  reports that he has never smoked. He does not have any smokeless tobacco history on file. He reports that he does not use illicit drugs. His alcohol history is not on file.  Allergies: Not on File  No prescriptions prior to admission    No results found for this or any previous visit (from the past 48 hour(s)). No results found.  Review of Systems  Musculoskeletal: Positive for joint pain (Right knee pain.).  All other systems reviewed and are negative.   There were no vitals taken for this visit. Physical Exam  Constitutional: He is oriented to person, place, and time. He appears well-developed and well-nourished.  HENT:  Head: Normocephalic and atraumatic.    Eyes: Conjunctivae and EOM are normal. Pupils are equal, round, and reactive to light.  Neck: Normal range of motion. Neck supple.  Cardiovascular: Normal rate, regular rhythm, normal heart sounds and intact distal pulses.   Respiratory: Effort normal.  GI: Soft.  Musculoskeletal: He exhibits tenderness (Pain right knee and medial joint line tenderness with positive McMurray medially).       Right knee: He exhibits effusion. Tenderness found. Medial joint line tenderness noted.       Legs: Neurological: He is alert and oriented to person, place, and time. He has normal reflexes.  Skin: Skin is warm and dry.  Psychiatric: He has a normal mood and affect. His behavior is normal. Judgment and thought content normal.     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  For operative arthroscopy and partial medial menisectomy as an outpatient.  He will need physical therapy post surgery.  Derek Olson 04/07/2014, 3:31 PM

## 2014-04-08 ENCOUNTER — Ambulatory Visit (HOSPITAL_COMMUNITY): Payer: Managed Care, Other (non HMO) | Admitting: Anesthesiology

## 2014-04-08 ENCOUNTER — Encounter (HOSPITAL_COMMUNITY): Payer: Self-pay | Admitting: *Deleted

## 2014-04-08 ENCOUNTER — Encounter (HOSPITAL_COMMUNITY)
Admission: RE | Disposition: A | Discharge status: Home / Self Care | Payer: Self-pay | Source: Ambulatory Visit | Attending: Orthopaedic Surgery

## 2014-04-08 ENCOUNTER — Ambulatory Visit (HOSPITAL_COMMUNITY)
Admission: RE | Admit: 2014-04-08 | Discharge: 2014-04-08 | Disposition: A | Discharge status: Home / Self Care | Payer: Managed Care, Other (non HMO) | Source: Ambulatory Visit | Attending: Orthopaedic Surgery | Admitting: Orthopaedic Surgery

## 2014-04-08 DIAGNOSIS — Y9389 Activity, other specified: Secondary | ICD-10-CM | POA: Diagnosis not present

## 2014-04-08 DIAGNOSIS — M2341 Loose body in knee, right knee: Secondary | ICD-10-CM | POA: Diagnosis not present

## 2014-04-08 DIAGNOSIS — I10 Essential (primary) hypertension: Secondary | ICD-10-CM | POA: Diagnosis not present

## 2014-04-08 DIAGNOSIS — S83241A Other tear of medial meniscus, current injury, right knee, initial encounter: Secondary | ICD-10-CM | POA: Insufficient documentation

## 2014-04-08 DIAGNOSIS — X58XXXA Exposure to other specified factors, initial encounter: Secondary | ICD-10-CM | POA: Diagnosis not present

## 2014-04-08 DIAGNOSIS — M179 Osteoarthritis of knee, unspecified: Secondary | ICD-10-CM | POA: Diagnosis not present

## 2014-04-08 HISTORY — PX: FOREIGN BODY REMOVAL: SHX962

## 2014-04-08 HISTORY — PX: KNEE ARTHROSCOPY: SHX127

## 2014-04-08 SURGERY — ARTHROSCOPY, KNEE
Anesthesia: General | Site: Knee | Laterality: Right

## 2014-04-08 MED ORDER — SODIUM CHLORIDE 0.9 % IR SOLN
Status: DC | PRN
  Administered 2014-04-08: 1000 mL

## 2014-04-08 MED ORDER — FENTANYL CITRATE 0.05 MG/ML IJ SOLN
INTRAMUSCULAR | Status: AC
  Filled 2014-04-08: qty 2

## 2014-04-08 MED ORDER — PROPOFOL 10 MG/ML IV BOLUS
INTRAVENOUS | Status: DC | PRN
  Administered 2014-04-08: 140 mg via INTRAVENOUS

## 2014-04-08 MED ORDER — SCOPOLAMINE 1 MG/3DAYS TD PT72
1.0000 | MEDICATED_PATCH | Freq: Once | TRANSDERMAL | Status: DC
  Administered 2014-04-08: 1.5 mg via TRANSDERMAL

## 2014-04-08 MED ORDER — LIDOCAINE HCL 1 % IJ SOLN
INTRAMUSCULAR | Status: DC | PRN
  Administered 2014-04-08: 35 mg via INTRADERMAL

## 2014-04-08 MED ORDER — LACTATED RINGERS IR SOLN
Status: DC | PRN
  Administered 2014-04-08 (×4): 3000 mL

## 2014-04-08 MED ORDER — FENTANYL CITRATE 0.05 MG/ML IJ SOLN
INTRAMUSCULAR | Status: AC
  Filled 2014-04-08: qty 5

## 2014-04-08 MED ORDER — DEXAMETHASONE SODIUM PHOSPHATE 4 MG/ML IJ SOLN
4.0000 mg | Freq: Once | INTRAMUSCULAR | Status: AC
  Administered 2014-04-08: 4 mg via INTRAVENOUS

## 2014-04-08 MED ORDER — ONDANSETRON HCL 4 MG/2ML IJ SOLN: 4.0000 mg | Freq: Once | INTRAMUSCULAR | Status: DC | PRN

## 2014-04-08 MED ORDER — MIDAZOLAM HCL 2 MG/2ML IJ SOLN
1.0000 mg | INTRAMUSCULAR | Status: DC | PRN
  Administered 2014-04-08: 2 mg via INTRAVENOUS

## 2014-04-08 MED ORDER — PROPOFOL 10 MG/ML IV EMUL
INTRAVENOUS | Status: AC
  Filled 2014-04-08: qty 20

## 2014-04-08 MED ORDER — BUPIVACAINE HCL (PF) 0.25 % IJ SOLN
INTRAMUSCULAR | Status: AC
  Filled 2014-04-08: qty 30

## 2014-04-08 MED ORDER — FENTANYL CITRATE 0.05 MG/ML IJ SOLN
25.0000 ug | INTRAMUSCULAR | Status: AC
  Administered 2014-04-08 (×2): 25 ug via INTRAVENOUS

## 2014-04-08 MED ORDER — MIDAZOLAM HCL 2 MG/2ML IJ SOLN
INTRAMUSCULAR | Status: AC
  Filled 2014-04-08: qty 2

## 2014-04-08 MED ORDER — LACTATED RINGERS IV SOLN
INTRAVENOUS | Status: DC
  Administered 2014-04-08: 1000 mL via INTRAVENOUS

## 2014-04-08 MED ORDER — ONDANSETRON HCL 4 MG/2ML IJ SOLN
INTRAMUSCULAR | Status: AC
  Filled 2014-04-08: qty 2

## 2014-04-08 MED ORDER — ONDANSETRON HCL 4 MG/2ML IJ SOLN
4.0000 mg | Freq: Once | INTRAMUSCULAR | Status: AC
  Administered 2014-04-08: 4 mg via INTRAVENOUS

## 2014-04-08 MED ORDER — CHLORHEXIDINE GLUCONATE 4 % EX LIQD: 60.0000 mL | Freq: Once | CUTANEOUS | Status: DC

## 2014-04-08 MED ORDER — LIDOCAINE HCL (PF) 1 % IJ SOLN
INTRAMUSCULAR | Status: AC
  Filled 2014-04-08: qty 5

## 2014-04-08 MED ORDER — DEXAMETHASONE SODIUM PHOSPHATE 4 MG/ML IJ SOLN
INTRAMUSCULAR | Status: AC
  Filled 2014-04-08: qty 1

## 2014-04-08 MED ORDER — FENTANYL CITRATE 0.05 MG/ML IJ SOLN
INTRAMUSCULAR | Status: DC | PRN
  Administered 2014-04-08 (×2): 50 ug via INTRAVENOUS
  Administered 2014-04-08: 25 ug via INTRAVENOUS

## 2014-04-08 MED ORDER — FENTANYL CITRATE 0.05 MG/ML IJ SOLN
25.0000 ug | INTRAMUSCULAR | Status: DC | PRN
  Administered 2014-04-08 (×2): 50 ug via INTRAVENOUS
  Filled 2014-04-08 (×2): qty 2

## 2014-04-08 MED ORDER — TRAMADOL HCL 50 MG PO TABS: 50.0000 mg | ORAL_TABLET | Freq: Four times a day (QID) | ORAL | Status: DC | PRN

## 2014-04-08 MED ORDER — BUPIVACAINE HCL (PF) 0.25 % IJ SOLN
INTRAMUSCULAR | Status: DC | PRN
  Administered 2014-04-08: 30 mL

## 2014-04-08 MED ORDER — SCOPOLAMINE 1 MG/3DAYS TD PT72
MEDICATED_PATCH | TRANSDERMAL | Status: AC
  Filled 2014-04-08: qty 1

## 2014-04-08 MED ORDER — MIDAZOLAM HCL 5 MG/5ML IJ SOLN
INTRAMUSCULAR | Status: DC | PRN
  Administered 2014-04-08: 2 mg via INTRAVENOUS

## 2014-04-08 SURGICAL SUPPLY — 59 items
BAG HAMPER (MISCELLANEOUS) ×2 IMPLANT
BANDAGE ELASTIC 6 VELCRO NS (GAUZE/BANDAGES/DRESSINGS) ×2 IMPLANT
BANDAGE ESMARK 4X12 BL STRL LF (DISPOSABLE) ×1 IMPLANT
BLADE SURG 15 STRL LF DISP TIS (BLADE) ×1 IMPLANT
BLADE SURG 15 STRL SS (BLADE) ×2
BLADE SURG SZ11 CARB STEEL (BLADE) ×2 IMPLANT
BNDG CMPR 12X4 ELC STRL LF (DISPOSABLE) ×1
BNDG ESMARK 4X12 BLUE STRL LF (DISPOSABLE) ×2
CLOTH BEACON ORANGE TIMEOUT ST (SAFETY) ×2 IMPLANT
CUFF TOURNIQUET SINGLE 34IN LL (TOURNIQUET CUFF) ×1 IMPLANT
CUFF TOURNIQUET SINGLE 44IN (TOURNIQUET CUFF) IMPLANT
CUTTER TOMCAT 4.0M (BURR) ×2 IMPLANT
DECANTER SPIKE VIAL GLASS SM (MISCELLANEOUS) ×2 IMPLANT
DRAPE PROXIMA HALF (DRAPES) ×2 IMPLANT
DRSG XEROFORM 1X8 (GAUZE/BANDAGES/DRESSINGS) ×2 IMPLANT
DURAPREP 26ML APPLICATOR (WOUND CARE) ×4 IMPLANT
ELECT MENISCUS 165MM 90D (ELECTRODE) IMPLANT
FORMALIN 10 PREFIL 480ML (MISCELLANEOUS) ×2 IMPLANT
GAUZE SPONGE 4X4 12PLY STRL (GAUZE/BANDAGES/DRESSINGS) ×2 IMPLANT
GAUZE SPONGE 4X4 16PLY XRAY LF (GAUZE/BANDAGES/DRESSINGS) ×2 IMPLANT
GLOVE BIO SURGEON STRL SZ8 (GLOVE) ×2 IMPLANT
GLOVE BIO SURGEON STRL SZ8.5 (GLOVE) ×2 IMPLANT
GLOVE BIOGEL PI IND STRL 7.5 (GLOVE) IMPLANT
GLOVE BIOGEL PI INDICATOR 7.5 (GLOVE) ×1
GLOVE EXAM NITRILE MD LF STRL (GLOVE) ×1 IMPLANT
GLOVE SURG SS PI 7.0 STRL IVOR (GLOVE) ×1 IMPLANT
GOWN STRL REUS W/TWL LRG LVL3 (GOWN DISPOSABLE) ×2 IMPLANT
GOWN STRL REUS W/TWL XL LVL3 (GOWN DISPOSABLE) ×2 IMPLANT
HANDPIECE (MISCELLANEOUS) IMPLANT
HLDR LEG FOAM (MISCELLANEOUS) ×1 IMPLANT
IV LACTATED RINGER IRRG 3000ML (IV SOLUTION) ×8
IV LR IRRIG 3000ML ARTHROMATIC (IV SOLUTION) ×2 IMPLANT
KIT BLADEGUARD II DBL (SET/KITS/TRAYS/PACK) ×2 IMPLANT
KIT ROOM TURNOVER AP CYSTO (KITS) ×2 IMPLANT
KNIFE HOOK (BLADE) IMPLANT
KNIFE MENISECTOMY (BLADE) IMPLANT
LEG HOLDER FOAM (MISCELLANEOUS) ×1
MANIFOLD NEPTUNE II (INSTRUMENTS) ×2 IMPLANT
MARKER SKIN DUAL TIP RULER LAB (MISCELLANEOUS) ×2 IMPLANT
NDL HYPO 21X1.5 SAFETY (NEEDLE) ×1 IMPLANT
NDL SPNL 18GX3.5 QUINCKE PK (NEEDLE) ×1 IMPLANT
NEEDLE HYPO 21X1.5 SAFETY (NEEDLE) ×2 IMPLANT
NEEDLE SPNL 18GX3.5 QUINCKE PK (NEEDLE) ×2 IMPLANT
NS IRRIG 1000ML POUR BTL (IV SOLUTION) ×2 IMPLANT
PACK ARTHRO LIMB DRAPE STRL (MISCELLANEOUS) ×1 IMPLANT
PAD ABD 5X9 TENDERSORB (GAUZE/BANDAGES/DRESSINGS) ×2 IMPLANT
PAD ARMBOARD 7.5X6 YLW CONV (MISCELLANEOUS) ×2 IMPLANT
PADDING CAST COTTON 6X4 STRL (CAST SUPPLIES) ×2 IMPLANT
PADDING WEBRIL 6 STERILE (GAUZE/BANDAGES/DRESSINGS) ×1 IMPLANT
SET ARTHROSCOPY INST (INSTRUMENTS) ×2 IMPLANT
SET BASIN LINEN APH (SET/KITS/TRAYS/PACK) ×2 IMPLANT
SPONGE GAUZE 4X4 12PLY (GAUZE/BANDAGES/DRESSINGS) ×1 IMPLANT
SUT ETHILON 3 0 FSL (SUTURE) ×2 IMPLANT
SYR 30ML LL (SYRINGE) ×2 IMPLANT
TIP 0 DEGREE (MISCELLANEOUS) IMPLANT
TIP 30 DEGREE (MISCELLANEOUS) IMPLANT
TIP 70 DEGREE (MISCELLANEOUS) IMPLANT
TUBING FLOSTEADY ARTHRO PUMP (IRRIGATION / IRRIGATOR) ×2 IMPLANT
YANKAUER SUCT BULB TIP 10FT TU (MISCELLANEOUS) ×4 IMPLANT

## 2014-04-08 NOTE — Discharge Instructions (Signed)
You may remove dressings when you desire.  Cleanse with peroxide and then put Band-aides over the stitches.  Keep wound dry otherwise.  Move knee often.  Use crutches at first then weight bear as tolerated.  Keep physical therapy appointment.  If any problem, call Dr. Brooke Bonito office at [phone] or if after hours, the hospital at [phone].  Keep appointment to Dr. Luna Glasgow in about ten days.   Arthroscopic Procedure, Knee, Care After Refer to this sheet in the next few weeks. These discharge instructions provide you with general information on caring for yourself after you leave the hospital. Your health care provider may also give you specific instructions. Your treatment has been planned according to the most current medical practices available, but unavoidable complications sometimes occur. If you have any problems or questions after discharge, please call your health care provider. HOME CARE INSTRUCTIONS   It is normal to be sore for a couple days after surgery. See your health care provider if this seems to be getting worse rather than better.  Only take over-the-counter or prescription medicines for pain, discomfort, or fever as directed by your health care provider.  Take showers rather than baths, or as directed by your health care provider.  Change bandages (dressings) if necessary or as directed.  You may resume normal diet and activities as directed or allowed.  Avoid lifting and driving until you are directed otherwise.  Make an appointment to see your health care provider for stitches (suture) or staple removal as directed.  You may put ice on the area.  Put the ice in a plastic bag. Place a towel between your skin and the bag.  Leave the ice on for 15-20 minutes, three to four times per day for the first 2 days.  Elevate the knee above the level of your heart to reduce swelling, and avoid dangling the leg.  Do 10-15 ankle pumps (pointing your toes toward you  and then away from you) two to three times daily.  If you are given compression stockings to wear after surgery, use them for as long as your surgeon tells you (around 10-14 days).  Avoid smoking and exposure to secondhand smoke. SEEK MEDICAL CARE IF:   You have increased bleeding from your wounds.  You see redness or swelling or you have increasing pain in your wounds.  You have pus coming from your wound.  You have a fever or persistent symptoms for more than 2-3 days.  You notice a bad smell coming from the wound or dressing.  You have severe pain with any motion of your knee. SEEK IMMEDIATE MEDICAL CARE IF:   You develop a rash.  You have difficulty breathing.  You develop any reaction or side effects to medicines taken.  You develop pain in the calves or back of the knee.  You develop chest pain, shortness of breath, or difficulty breathing.  You develop numbness or tingling in the leg or foot. MAKE SURE YOU:   Understand these instructions.  Will watch your condition.  Will get help right away if you are not doing well or you get worse. Document Released: 11/05/2004 Document Revised: 04/23/2013 Document Reviewed: 09/13/2012 [facility] Patient Information 2015 [place], [place]. This information is not intended to replace advice given to you by your health care provider. Make sure you discuss any questions you have with your health care provider.

## 2014-04-08 NOTE — Transfer of Care (Signed)
Immediate Anesthesia Transfer of Care Note  Patient: Derek Olson  Procedure(s) Performed: Procedure(s): ARTHROSCOPY RIGHT KNEE, PARTIAL MEDIAL MENISECTOMY (Right) REMOVAL LOOSE BODY RIGHT KNEE (Right)  Patient Location: PACU  Anesthesia Type:General  Level of Consciousness: awake and patient cooperative  Airway & Oxygen Therapy: Patient Spontanous Breathing and Patient connected to face mask oxygen  Post-op Assessment: Report given to PACU RN, Post -op Vital signs reviewed and stable and Patient moving all extremities  Post vital signs: Reviewed and stable  Complications: No apparent anesthesia complications

## 2014-04-08 NOTE — Anesthesia Procedure Notes (Signed)
Procedure Name: LMA Insertion Date/Time: 04/08/2014 7:35 AM Performed by: Charmaine Downs Pre-anesthesia Checklist: Emergency Drugs available, Patient identified, Suction available and Patient being monitored Patient Re-evaluated:Patient Re-evaluated prior to inductionOxygen Delivery Method: Circle system utilized Preoxygenation: Pre-oxygenation with 100% oxygen Intubation Type: IV induction Ventilation: Mask ventilation without difficulty LMA: LMA inserted LMA Size: 4.0 Grade View: Grade II Tube type: Oral Number of attempts: 1 Placement Confirmation: positive ETCO2 and breath sounds checked- equal and bilateral Tube secured with: Tape Dental Injury: Teeth and Oropharynx as per pre-operative assessment

## 2014-04-08 NOTE — Brief Op Note (Signed)
04/08/2014  8:33 AM  PATIENT:  Derek Olson  58 y.o. male  PRE-OPERATIVE DIAGNOSIS:  tear right knee medial meniscus and degenerative joint disease  POST-OPERATIVE DIAGNOSIS:  tear right knee medial meniscus, degenerative joint disease and loose body right knee  PROCEDURE:  Procedure(s): ARTHROSCOPY KNEE (Right), partial medial menisectomy, removal of loose body medial side of the right knee.  SURGEON:  Surgeon(s) and Role:    * Sanjuana Kava, MD - Primary  PHYSICIAN ASSISTANT:   ASSISTANTS: none   ANESTHESIA:   general  EBL:  Total I/O In: 600 [I.V.:600] Out: -   BLOOD ADMINISTERED:none  DRAINS: none   LOCAL MEDICATIONS USED:  MARCAINE     SPECIMEN:  Source of Specimen:  right knee medial meniscus and also loose body  DISPOSITION OF SPECIMEN:  PATHOLOGY  COUNTS:  YES  TOURNIQUET:   Total Tourniquet Time Documented: Thigh (Right) - 34 minutes Total: Thigh (Right) - 34 minutes   DICTATION: .Other Dictation: Dictation Number [id number]  PLAN OF CARE: Discharge to home after PACU  PATIENT DISPOSITION:  PACU - hemodynamically stable.   Delay start of Pharmacological VTE agent (>24hrs) due to surgical blood loss or risk of bleeding: not applicable

## 2014-04-08 NOTE — Progress Notes (Signed)
The History and Physical is unchanged. I have examined the patient. The patient is medically able to have surgery on the right knee . [place] 

## 2014-04-08 NOTE — Anesthesia Postprocedure Evaluation (Signed)
  Anesthesia Post-op Note  Patient: Derek Olson  Procedure(s) Performed: Procedure(s): ARTHROSCOPY RIGHT KNEE, PARTIAL MEDIAL MENISECTOMY (Right) REMOVAL LOOSE BODY RIGHT KNEE (Right)  Patient Location: PACU  Anesthesia Type:General  Level of Consciousness: awake, alert , oriented and patient cooperative  Airway and Oxygen Therapy: Patient Spontanous Breathing  Post-op Pain: 3 /10, mild  Post-op Assessment: Post-op Vital signs reviewed, Patient's Cardiovascular Status Stable, Respiratory Function Stable, Patent Airway, No signs of Nausea or vomiting and Pain level controlled  Post-op Vital Signs: Reviewed and stable  Last Vitals:  Filed Vitals:   04/08/14 0849  BP: 116/66  Pulse:   Temp: 36.5 C  Resp:     Complications: No apparent anesthesia complications

## 2014-04-08 NOTE — Anesthesia Preprocedure Evaluation (Signed)
Anesthesia Evaluation  Patient identified by MRN, date of birth, ID band Patient awake    Reviewed: Allergy & Precautions, H&P , NPO status , Patient's Chart, lab work & pertinent test results  History of Anesthesia Complications (+) PONV and history of anesthetic complications  Airway Mallampati: II  TM Distance: >3 FB     Dental  (+) Teeth Intact   Pulmonary neg pulmonary ROS,  breath sounds clear to auscultation        Cardiovascular hypertension, Pt. on medications Rhythm:Regular Rate:Normal     Neuro/Psych    GI/Hepatic negative GI ROS,   Endo/Other    Renal/GU      Musculoskeletal   Abdominal   Peds  Hematology   Anesthesia Other Findings   Reproductive/Obstetrics                             Anesthesia Physical Anesthesia Plan  ASA: II  Anesthesia Plan: General   Post-op Pain Management:    Induction: Intravenous  Airway Management Planned: LMA  Additional Equipment:   Intra-op Plan:   Post-operative Plan: Extubation in OR  Informed Consent: I have reviewed the patients History and Physical, chart, labs and discussed the procedure including the risks, benefits and alternatives for the proposed anesthesia with the patient or authorized representative who has indicated his/her understanding and acceptance.     Plan Discussed with:   Anesthesia Plan Comments:         Anesthesia Quick Evaluation

## 2014-04-09 ENCOUNTER — Encounter (HOSPITAL_COMMUNITY): Payer: Self-pay | Admitting: Orthopaedic Surgery

## 2014-04-09 NOTE — Op Note (Signed)
NAME:  Derek Olson, Derek Olson               ACCOUNT NO.:  [id number]  MEDICAL RECORD NO.:  [id number]  LOCATION:  [facility]                          FACILITY:  [facility]  PHYSICIAN:  J. Sanjuana Kava, M.D. DATE OF BIRTH:  1956-03-30  DATE OF PROCEDURE: DATE OF DISCHARGE:  04/08/2014                              OPERATIVE REPORT   PREOPERATIVE DIAGNOSIS: 1. Tear medial meniscus, right knee. 2. Severe degenerative joint disease.  POSTOPERATIVE DIAGNOSIS: 1. Tear medial meniscus, right knee. 2. Severe degenerative joint disease. 3. Plus loose body, right knee.  PROCEDURE: 1. Arthroscopy of the right knee. 2. Partial medial meniscectomy. 3. Removal of loose body.  ANESTHESIA:  General.  TOURNIQUET TIME:  34 minutes.  SURGEON:  Sanjuana Kava, M.D.  DRAINS:  No drains.  INDICATIONS:  The patient having pain and tenderness in his knee with giving away and some locking.  MRI shows severe degenerative joint disease of the knee plus degenerative tear of the remaining part of the medial meniscus of the knee.  The patient previously had arthroscopy of the knee in the past, last done by me in 2009.  Because it was not getting any better and because of the pain, surgery was recommended. The patient desired to wait until after Thanksgiving and do the surgery before Christmas.  He understands the risks and imponderables of the procedure.  He asked appropriate questions.  He has undergone this before.  FINDINGS:  The patient was seen in the holding area.  The right knee was identified as a correct surgical site.  I placed a mark with indelible ink on his right knee.  I talked to his family.  The patient was then taken to the operating room and given general anesthesia while supine on the operating room table.  Tourniquet and leg holder had been applied to the right upper thigh.  The patient was prepped and draped in the usual manner.  After generalized time-out, we identified the patient as Derek Olson and that we were prepared to do arthroscopy of the right knee.  All the instrumentation was properly positioned and working.  The OR team knew each other.  The patient was prepped and draped.  The leg was elevated, wrapped circumferentially with an Esmarch bandage.  Tourniquet inflated to 300 mmHg.  Esmarch bandage removed.  Medially Lactated Ringer's was instilled into the knee by an infusion pump.  Arthroscope was inserted laterally.  We had some difficulty with viewing and difficulty at first with the pump, then I changed the inflow to the point on the arthroscope itself and we were able to see very nicely.  Suprapatellar pouch showed mild degenerative changes, grade 2-3 changes around the patella inferior aspect.  Medially, there was severe eburnated bone, grade 4 changes particularly of the tibial plateau, grade 3 and some early grade 4 changes of the femoral condyle.  There was a degenerative tear of the remaining part of the medial meniscus.  Anterior cruciate was intact and laterally the knee looked very good, grade 2 changes with some slight fraying of the meniscus.  Attention was directed to the medial side.  Using a meniscal probe and meniscal shaver, good smooth contour was obtained  of the remaining part of the medial meniscus.  I was taking permanent pictures.  A loose body came into the field.  This was removed with grasping forceps.  I removed it and it was approximately 1 cm long by about 4 mm at its greatest width and about 2 to 2-1/2 at its minimal width.  Permanent pictures were taken around the knee.  No new pathology found. No other loose bodies were found.  As I was preparing to close the wound while still everything was sterile,  I was informed by the circulating nurse that the picture recording device mechanism suddenly malfunctioned and all the pictures I had taken during the procedure were lost.  I took pictures of the loose body again on the  back-table with a ruler present to get an idea of the exact size and then because everything was still sterile, I reinserted the arthroscope and took pictures of the final viewing showing a very large defect almost like a crater on the tibial plateau.  Some blood in the knee that took me few minutes to washout the blood.  The pictures that I had taken throughout the procedure on timely basis were totally lost.  I do not know what happened.  At least I had some pictures to show.  Wounds were then reapproximated using 3-0 nylon interrupted vertical mattress manner.  Marcaine 0.25% was instilled into the knee portals.  Tourniquet was deflated after 34 minutes.  Sterile dressing applied.  Bulky dressing applied.  The patient tolerated the procedure well and went to recovery in good condition.  Given tramadol for pain.  If any difficulties, contact me through the office hospital beeper system.  See me in my office in approximately 10 days to 2 weeks.          ______________________________ Lenna Sciara. Sanjuana Kava, M.D.     JWK/MEDQ  D:  04/08/2014  T:  04/08/2014  Job:  [id number]

## 2015-10-11 ENCOUNTER — Ambulatory Visit: Payer: Self-pay | Admitting: Orthopedic Surgery

## 2015-11-11 NOTE — Patient Instructions (Signed)
Derek Olson  11/11/2015   Your procedure is scheduled on: 11/23/2015    Report to [facility]  Entrance take [place]  elevators to 3rd floor to  [place] at  0830 AM.  Call this number if you have problems the morning of surgery [phone]   Remember: ONLY 1 PERSON MAY GO WITH YOU TO SHORT STAY TO GET  READY MORNING OF [place].  Do not eat food or drink liquids :After Midnight.     Take these medicines the morning of surgery with A SIP OF WATER: none                                 You may not have any metal on your body including hair pins and              piercings  Do not wear jewelry,  lotions, powders or perfumes, deodorant           .              Men may shave face and neck.   Do not bring valuables to the hospital. [place].  Contacts, dentures or bridgework may not be worn into surgery.  Leave suitcase in the car. After surgery it may be brought to your room.         Special Instructions: coughing and deep breathing exercises, leg exercises               Please read over the following fact sheets you were given: _____________________________________________________________________             [facility] - Preparing for Surgery Before surgery, you can play an important role.  Because skin is not sterile, your skin needs to be as free of germs as possible.  You can reduce the number of germs on your skin by washing with CHG (chlorahexidine gluconate) soap before surgery.  CHG is an antiseptic cleaner which kills germs and bonds with the skin to continue killing germs even after washing. Please DO NOT use if you have an allergy to CHG or antibacterial soaps.  If your skin becomes reddened/irritated stop using the CHG and inform your nurse when you arrive at Short Stay. Do not shave (including legs and underarms) for at least 48 hours prior to the first CHG shower.  You may  shave your face/neck. Please follow these instructions carefully:  1.  Shower with CHG Soap the night before surgery and the  morning of Surgery.  2.  If you choose to wash your hair, wash your hair first as usual with your  normal  shampoo.  3.  After you shampoo, rinse your hair and body thoroughly to remove the  shampoo.                           4.  Use CHG as you would any other liquid soap.  You can apply chg directly  to the skin and wash                       Gently with a scrungie or clean washcloth.  5.  Apply the  CHG Soap to your body ONLY FROM THE NECK DOWN.   Do not use on face/ open                           Wound or open sores. Avoid contact with eyes, ears mouth and genitals (private parts).                       Wash face,  Genitals (private parts) with your normal soap.             6.  Wash thoroughly, paying special attention to the area where your surgery  will be performed.  7.  Thoroughly rinse your body with warm water from the neck down.  8.  DO NOT shower/wash with your normal soap after using and rinsing off  the CHG Soap.                9.  Pat yourself dry with a clean towel.            10.  Wear clean pajamas.            11.  Place clean sheets on your bed the night of your first shower and do not  sleep with pets. Day of Surgery : Do not apply any lotions/deodorants the morning of surgery.  Please wear clean clothes to the hospital/surgery center.  FAILURE TO FOLLOW THESE INSTRUCTIONS MAY RESULT IN THE CANCELLATION OF YOUR SURGERY PATIENT SIGNATURE_________________________________  NURSE SIGNATURE__________________________________  ________________________________________________________________________  WHAT IS A BLOOD TRANSFUSION? Blood Transfusion Information  A transfusion is the replacement of blood or some of its parts. Blood is made up of multiple cells which provide different functions.  Red blood cells carry oxygen and are used for blood loss  replacement.  White blood cells fight against infection.  Platelets control bleeding.  Plasma helps clot blood.  Other blood products are available for specialized needs, such as hemophilia or other clotting disorders. BEFORE THE TRANSFUSION  Who gives blood for transfusions?   Healthy volunteers who are fully evaluated to make sure their blood is safe. This is blood bank blood. Transfusion therapy is the safest it has ever been in the practice of medicine. Before blood is taken from a donor, a complete history is taken to make sure that person has no history of diseases nor engages in risky social behavior (examples are intravenous drug use or sexual activity with multiple partners). The donor's travel history is screened to minimize risk of transmitting infections, such as malaria. The donated blood is tested for signs of infectious diseases, such as HIV and hepatitis. The blood is then tested to be sure it is compatible with you in order to minimize the chance of a transfusion reaction. If you or a relative donates blood, this is often done in anticipation of surgery and is not appropriate for emergency situations. It takes many days to process the donated blood. RISKS AND COMPLICATIONS Although transfusion therapy is very safe and saves many lives, the main dangers of transfusion include:  1. Getting an infectious disease. 2. Developing a transfusion reaction. This is an allergic reaction to something in the blood you were given. Every precaution is taken to prevent this. The decision to have a blood transfusion has been considered carefully by your caregiver before blood is given. Blood is not given unless the benefits outweigh the risks. AFTER THE TRANSFUSION  Right after receiving a blood transfusion,  you will usually feel much better and more energetic. This is especially true if your red blood cells have gotten low (anemic). The transfusion raises the level of the red blood cells which  carry oxygen, and this usually causes an energy increase.  The nurse administering the transfusion will monitor you carefully for complications. HOME CARE INSTRUCTIONS  No special instructions are needed after a transfusion. You may find your energy is better. Speak with your caregiver about any limitations on activity for underlying diseases you may have. SEEK MEDICAL CARE IF:   Your condition is not improving after your transfusion.  You develop redness or irritation at the intravenous (IV) site. SEEK IMMEDIATE MEDICAL CARE IF:  Any of the following symptoms occur over the next 12 hours:  Shaking chills.  You have a temperature by mouth above 102 F (38.9 C), not controlled by medicine.  Chest, back, or muscle pain.  People around you feel you are not acting correctly or are confused.  Shortness of breath or difficulty breathing.  Dizziness and fainting.  You get a rash or develop hives.  You have a decrease in urine output.  Your urine turns a dark color or changes to pink, red, or brown. Any of the following symptoms occur over the next 10 days:  You have a temperature by mouth above 102 F (38.9 C), not controlled by medicine.  Shortness of breath.  Weakness after normal activity.  The white part of the eye turns yellow (jaundice).  You have a decrease in the amount of urine or are urinating less often.  Your urine turns a dark color or changes to pink, red, or brown. Document Released: 04/15/2000 Document Revised: 07/11/2011 Document Reviewed: 12/03/2007 ExitCare Patient Information 2014 [place].  _______________________________________________________________________  Incentive Spirometer  An incentive spirometer is a tool that can help keep your lungs clear and active. This tool measures how well you are filling your lungs with each breath. Taking long deep breaths may help reverse or decrease the chance of developing breathing (pulmonary) problems  (especially infection) following:  A long period of time when you are unable to move or be active. BEFORE THE PROCEDURE   If the spirometer includes an indicator to show your best effort, your nurse or respiratory therapist will set it to a desired goal.  If possible, sit up straight or lean slightly forward. Try not to slouch.  Hold the incentive spirometer in an upright position. INSTRUCTIONS FOR USE  3. Sit on the edge of your bed if possible, or sit up as far as you can in bed or on a chair. 4. Hold the incentive spirometer in an upright position. 5. Breathe out normally. 6. Place the mouthpiece in your mouth and seal your lips tightly around it. 7. Breathe in slowly and as deeply as possible, raising the piston or the ball toward the top of the column. 8. Hold your breath for 3-5 seconds or for as long as possible. Allow the piston or ball to fall to the bottom of the column. 9. Remove the mouthpiece from your mouth and breathe out normally. 10. Rest for a few seconds and repeat Steps 1 through 7 at least 10 times every 1-2 hours when you are awake. Take your time and take a few normal breaths between deep breaths. 11. The spirometer may include an indicator to show your best effort. Use the indicator as a goal to work toward during each repetition. 12. After each set of 10 deep breaths,  practice coughing to be sure your lungs are clear. If you have an incision (the cut made at the time of surgery), support your incision when coughing by placing a pillow or rolled up towels firmly against it. Once you are able to get out of bed, walk around indoors and cough well. You may stop using the incentive spirometer when instructed by your caregiver.  RISKS AND COMPLICATIONS  Take your time so you do not get dizzy or light-headed.  If you are in pain, you may need to take or ask for pain medication before doing incentive spirometry. It is harder to take a deep breath if you are having  pain. AFTER USE  Rest and breathe slowly and easily.  It can be helpful to keep track of a log of your progress. Your caregiver can provide you with a simple table to help with this. If you are using the spirometer at home, follow these instructions: [place] IF:   You are having difficultly using the spirometer.  You have trouble using the spirometer as often as instructed.  Your pain medication is not giving enough relief while using the spirometer.  You develop fever of 100.5 F (38.1 C) or higher. SEEK IMMEDIATE MEDICAL CARE IF:   You cough up bloody sputum that had not been present before.  You develop fever of 102 F (38.9 C) or greater.  You develop worsening pain at or near the incision site. MAKE SURE YOU:   Understand these instructions.  Will watch your condition.  Will get help right away if you are not doing well or get worse. Document Released: 08/29/2006 Document Revised: 07/11/2011 Document Reviewed: 10/30/2006 [facility] Patient Information 2014 [place], [place].   ________________________________________________________________________

## 2015-11-16 ENCOUNTER — Encounter (HOSPITAL_COMMUNITY)
Admission: RE | Admit: 2015-11-16 | Discharge: 2015-11-16 | Disposition: A | Discharge status: Home / Self Care | Payer: Managed Care, Other (non HMO) | Source: Ambulatory Visit | Attending: Orthopedic Surgery | Admitting: Orthopedic Surgery

## 2015-11-16 ENCOUNTER — Encounter (HOSPITAL_COMMUNITY): Payer: Self-pay

## 2015-11-16 DIAGNOSIS — Z01812 Encounter for preprocedural laboratory examination: Secondary | ICD-10-CM | POA: Insufficient documentation

## 2015-11-16 HISTORY — DX: Other specified postprocedural states: Z98.890

## 2015-11-16 HISTORY — DX: Malignant (primary) neoplasm, unspecified: C80.1

## 2015-11-16 HISTORY — DX: Other specified postprocedural states: R11.2

## 2015-11-16 LAB — URINALYSIS, ROUTINE W REFLEX MICROSCOPIC
BILIRUBIN URINE: NEGATIVE
GLUCOSE, UA: NEGATIVE mg/dL
Hgb urine dipstick: NEGATIVE
Ketones, ur: NEGATIVE mg/dL
LEUKOCYTES UA: NEGATIVE
NITRITE: NEGATIVE
PH: 6.5 (ref 5.0–8.0)
Protein, ur: NEGATIVE mg/dL
SPECIFIC GRAVITY, URINE: 1.012 (ref 1.005–1.030)

## 2015-11-16 LAB — CBC
HCT: 37.5 % — ABNORMAL LOW (ref 39.0–52.0)
Hemoglobin: 13.5 g/dL (ref 13.0–17.0)
MCH: 32.4 pg (ref 26.0–34.0)
MCHC: 36 g/dL (ref 30.0–36.0)
MCV: 89.9 fL (ref 78.0–100.0)
PLATELETS: 187 10*3/uL (ref 150–400)
RBC: 4.17 MIL/uL — AB (ref 4.22–5.81)
RDW: 13.2 % (ref 11.5–15.5)
WBC: 5 10*3/uL (ref 4.0–10.5)

## 2015-11-16 LAB — SURGICAL PCR SCREEN
MRSA, PCR: NEGATIVE
STAPHYLOCOCCUS AUREUS: NEGATIVE

## 2015-11-16 LAB — COMPREHENSIVE METABOLIC PANEL
ALT: 20 U/L (ref 17–63)
AST: 30 U/L (ref 15–41)
Albumin: 4.2 g/dL (ref 3.5–5.0)
Alkaline Phosphatase: 43 U/L (ref 38–126)
Anion gap: 4 — ABNORMAL LOW (ref 5–15)
BILIRUBIN TOTAL: 1 mg/dL (ref 0.3–1.2)
BUN: 16 mg/dL (ref 6–20)
CO2: 28 mmol/L (ref 22–32)
Calcium: 9.2 mg/dL (ref 8.9–10.3)
Chloride: 106 mmol/L (ref 101–111)
Creatinine, Ser: 0.75 mg/dL (ref 0.61–1.24)
Glucose, Bld: 100 mg/dL — ABNORMAL HIGH (ref 65–99)
POTASSIUM: 4.8 mmol/L (ref 3.5–5.1)
SODIUM: 138 mmol/L (ref 135–145)
TOTAL PROTEIN: 6.6 g/dL (ref 6.5–8.1)

## 2015-11-16 LAB — TYPE AND SCREEN
ABO/RH(D): A POS
ANTIBODY SCREEN: NEGATIVE

## 2015-11-16 LAB — ABO/RH: ABO/RH(D): A POS

## 2015-11-16 LAB — PROTIME-INR
INR: 1.1 (ref 0.00–1.49)
PROTHROMBIN TIME: 13.9 s (ref 11.6–15.2)

## 2015-11-16 LAB — APTT: APTT: 30 s (ref 24–37)

## 2015-11-16 NOTE — Progress Notes (Signed)
EKG- 06/05/15- chart  Interpetation in OV note Clearance- Dr Fagan-06/05/15 on chart  LOV with Dr Willey Blade- 06/05/15- chart

## 2015-11-22 ENCOUNTER — Ambulatory Visit: Payer: Self-pay | Admitting: Orthopedic Surgery

## 2015-11-22 NOTE — H&P (Signed)
Derek Olson DOB: [birth year] Married / Language: English / Race: White Male Date of Admission:  11/23/2015 CC:  Right Knee Pain History of Present Illness The patient is a 60 year old male who comes in for a preoperative History and Physical. The patient is scheduled for a right total knee arthroplasty to be performed by Dr. Dione Plover. Aluisio, MD at [facility] on 11/23/2015.. The patient is a 60 year old male who presented for follow up of their knee. The patient is being followed for their right knee pain and osteoarthritis. They are now month(s) out from Synvisc series. Symptoms reported include: pain, swelling, aching and popping (occasionally). The patient feels that they are doing poorly and report their pain level to be mild to moderate. Current treatment includes: home exercise program and activity modification. The following medication has been used for pain control: antiinflammatory medication (ibuprofen, after exercise). The patient has not gotten any relief of their symptoms with viscosupplementation (very minimal help). The patient indicates that they have questions or concerns today regarding pain, activity, their progress at this point and want to discuss surgery. Unfortunately his right knee is getting progressively worse over time. The viscosupplements did not provide a tremendous amount of benefit. His condition is getting progressively worse. The right knee is limiting what he can and cannot do. He would like to be more active, but the knee is preventing him from doing so. The pain is occurring mostly with activities, but even occurs at rest. He is now ready to proceed with surical intervention. They have been treated conservatively in the past for the above stated problem and despite conservative measures, they continue to have progressive pain and severe functional limitations and dysfunction. They have failed non-operative management including home exercise, medications, and  injections. It is felt that they would benefit from undergoing total joint replacement. Risks and benefits of the procedure have been discussed with the patient and they elect to proceed with surgery. There are no active contraindications to surgery such as ongoing infection or rapidly progressive neurological disease.  Problem List/Past Medical  Primary osteoarthritis of right knee (M17.11)  High blood pressure  Skin Cancer  Hemorrhoids Impaired Fasting Glucose Globus Phenomenon Insomnia  Allergies HYDROCODONE  Itching.  Family History Osteoarthritis  mother Rheumatoid Arthritis  mother Cancer  mother  Social History Drug/Alcohol Rehab (Previously)  no Exercise  Exercises rarely; does other Illicit drug use  no Children  2 Current work status  working full time Engineer, agricultural (Currently)  no Living situation  live alone Tobacco / smoke exposure  yes outdoors only Tobacco use  never smoker Marital status  divorced Number of flights of stairs before winded  4-5 Pain Contract  no Alcohol use  never consumed alcohol  Medication History Benicar (20MG  Tablet, Oral) Active. Loratadine (10MG  Tablet, Oral) Active. Multiple Vitamin (1 (one) Oral) Active. Super B Complex Active. Ibuprofen Active.  Past Surgical History Sinus Surgery  Straighten Nasal Septum  Vasectomy  Arthroscopy of Knee  right Arthroscopy of Shoulder  bilateral Rotator Cuff Repair  bilateral  Review of Systems General Not Present- Chills, Fatigue, Fever, Memory Loss, Night Sweats, Weight Gain and Weight Loss. Skin Not Present- Eczema, Hives, Itching, Lesions and Rash. HEENT Not Present- Dentures, Double Vision, Headache, Hearing Loss, Tinnitus and Visual Loss. Respiratory Not Present- Allergies, Chronic Cough, Coughing up blood, Shortness of breath at rest and Shortness of breath with exertion. Cardiovascular Not Present- Chest Pain, Difficulty Breathing Lying Down,  Murmur, Palpitations,  Racing/skipping heartbeats and Swelling. Gastrointestinal Not Present- Abdominal Pain, Bloody Stool, Constipation, Diarrhea, Difficulty Swallowing, Heartburn, Jaundice, Loss of appetitie, Nausea and Vomiting. Male Genitourinary Not Present- Blood in Urine, Discharge, Flank Pain, Incontinence, Painful Urination, Urgency, Urinary frequency, Urinary Retention, Urinating at Night and Weak urinary stream. Musculoskeletal Present- Joint Pain, Joint Swelling and Morning Stiffness. Not Present- Back Pain, Muscle Pain, Muscle Weakness and Spasms. Neurological Not Present- Blackout spells, Difficulty with balance, Dizziness, Paralysis, Tremor and Weakness. Psychiatric Not Present- Insomnia.  Vitals Weight: 204 lb Height: 69in Weight was reported by patient. Height was reported by patient. Body Surface Area: 2.08 m Body Mass Index: 30.13 kg/m  Pulse: 64 (Regular)  BP: 138/88 (Sitting, Right Arm, Standard)   Physical Exam General Mental Status -Alert, cooperative and good historian. General Appearance-pleasant, Not in acute distress. Orientation-Oriented X3. Build & Nutrition-Well nourished and Well developed.  Head and Neck Head-normocephalic, atraumatic . Neck Global Assessment - supple, no bruit auscultated on the right, no bruit auscultated on the left.  Eye Vision-Wears corrective lenses. Pupil - Bilateral-Regular and Round. Motion - Bilateral-EOMI.  Chest and Lung Exam Auscultation Breath sounds - clear at anterior chest wall and clear at posterior chest wall. Adventitious sounds - No Adventitious sounds.  Cardiovascular Auscultation Rhythm - Regular rate and rhythm. Heart Sounds - S1 WNL and S2 WNL. Murmurs & Other Heart Sounds - Auscultation of the heart reveals - No Murmurs.  Abdomen Palpation/Percussion Tenderness - Abdomen is non-tender to palpation. Rigidity (guarding) - Abdomen is soft. Auscultation Auscultation of the  abdomen reveals - Bowel sounds normal.  Male Genitourinary Note: Not done, not pertinent to present illness   Musculoskeletal Note: He is alert and oriented, in no apparent distress. Evaluation of his right hip shows that he has normal range of motion without discomfort. His right knee shows no effusion. His range of motion of the right knee is about 5 to 125. There was marked crepitus on range of motion. He does have significant tenderness medially. There is no lateral tenderness noted or instability. His left knee exam is unremarkable.  RADIOGRAPHS Reviewed AP both knees and lateral and he has got bone on bone arthritis in the medial and patellofemoral compartments of the right knee.  Assessment & Plan Primary osteoarthritis of right knee (M17.11)  Note:Surgical Plans: Right Total Knee Replacement  Disposition: Home  PCP: Dr. Willey Blade - Patient has been seen preoperatively and felt to be stable for surgery.  IV TXA  Anesthesia Issues: None except nausea one time  Signed electronically by Ok Edwards, III PA-C

## 2015-11-23 ENCOUNTER — Inpatient Hospital Stay (HOSPITAL_COMMUNITY): Payer: Managed Care, Other (non HMO) | Admitting: Certified Registered Nurse Anesthetist

## 2015-11-23 ENCOUNTER — Encounter (HOSPITAL_COMMUNITY)
Admission: RE | Disposition: A | Discharge status: Home Health | Payer: Self-pay | Source: Ambulatory Visit | Attending: Orthopedic Surgery

## 2015-11-23 ENCOUNTER — Encounter (HOSPITAL_COMMUNITY): Payer: Self-pay | Admitting: *Deleted

## 2015-11-23 ENCOUNTER — Inpatient Hospital Stay (HOSPITAL_COMMUNITY)
Admission: RE | Admit: 2015-11-23 | Discharge: 2015-11-25 | DRG: 470 | Disposition: A | Discharge status: Home Health | Payer: Managed Care, Other (non HMO) | Source: Ambulatory Visit | Attending: Orthopedic Surgery | Admitting: Orthopedic Surgery

## 2015-11-23 DIAGNOSIS — Z8261 Family history of arthritis: Secondary | ICD-10-CM

## 2015-11-23 DIAGNOSIS — Z79899 Other long term (current) drug therapy: Secondary | ICD-10-CM | POA: Diagnosis not present

## 2015-11-23 DIAGNOSIS — M1711 Unilateral primary osteoarthritis, right knee: Principal | ICD-10-CM | POA: Diagnosis present

## 2015-11-23 DIAGNOSIS — I10 Essential (primary) hypertension: Secondary | ICD-10-CM | POA: Diagnosis present

## 2015-11-23 DIAGNOSIS — M25561 Pain in right knee: Secondary | ICD-10-CM | POA: Diagnosis present

## 2015-11-23 DIAGNOSIS — M1710 Unilateral primary osteoarthritis, unspecified knee: Secondary | ICD-10-CM | POA: Diagnosis present

## 2015-11-23 DIAGNOSIS — M179 Osteoarthritis of knee, unspecified: Secondary | ICD-10-CM | POA: Diagnosis present

## 2015-11-23 HISTORY — PX: TOTAL KNEE ARTHROPLASTY: SHX125

## 2015-11-23 SURGERY — ARTHROPLASTY, KNEE, TOTAL
Anesthesia: Monitor Anesthesia Care | Site: Knee | Laterality: Right

## 2015-11-23 MED ORDER — SODIUM CHLORIDE 0.9 % IJ SOLN
INTRAMUSCULAR | Status: AC
  Filled 2015-11-23: qty 50

## 2015-11-23 MED ORDER — FENTANYL CITRATE (PF) 100 MCG/2ML IJ SOLN
INTRAMUSCULAR | Status: DC | PRN
  Administered 2015-11-23 (×2): 50 ug via INTRAVENOUS

## 2015-11-23 MED ORDER — BUPIVACAINE LIPOSOME 1.3 % IJ SUSP
INTRAMUSCULAR | Status: DC | PRN
  Administered 2015-11-23: 50 mL

## 2015-11-23 MED ORDER — ACETAMINOPHEN 650 MG RE SUPP: 650.0000 mg | Freq: Four times a day (QID) | RECTAL | Status: DC | PRN

## 2015-11-23 MED ORDER — PROPOFOL 500 MG/50ML IV EMUL
INTRAVENOUS | Status: DC | PRN
  Administered 2015-11-23: 75 ug/kg/min via INTRAVENOUS

## 2015-11-23 MED ORDER — SODIUM CHLORIDE 0.9 % IR SOLN
Status: DC | PRN
  Administered 2015-11-23: 1000 mL

## 2015-11-23 MED ORDER — FENTANYL CITRATE (PF) 100 MCG/2ML IJ SOLN
INTRAMUSCULAR | Status: AC
  Filled 2015-11-23: qty 2

## 2015-11-23 MED ORDER — DEXAMETHASONE SODIUM PHOSPHATE 10 MG/ML IJ SOLN
10.0000 mg | Freq: Once | INTRAMUSCULAR | Status: AC
  Administered 2015-11-23: 10 mg via INTRAVENOUS

## 2015-11-23 MED ORDER — ACETAMINOPHEN 10 MG/ML IV SOLN
INTRAVENOUS | Status: AC
  Filled 2015-11-23: qty 100

## 2015-11-23 MED ORDER — PHENOL 1.4 % MT LIQD
1.0000 | OROMUCOSAL | Status: DC | PRN
  Filled 2015-11-23: qty 177

## 2015-11-23 MED ORDER — TRANEXAMIC ACID 1000 MG/10ML IV SOLN
1000.0000 mg | Freq: Once | INTRAVENOUS | Status: AC
  Administered 2015-11-23: 1000 mg via INTRAVENOUS
  Filled 2015-11-23: qty 10

## 2015-11-23 MED ORDER — CEFAZOLIN SODIUM-DEXTROSE 2-4 GM/100ML-% IV SOLN
2.0000 g | INTRAVENOUS | Status: AC
  Administered 2015-11-23: 2 g via INTRAVENOUS
  Filled 2015-11-23: qty 100

## 2015-11-23 MED ORDER — TRAMADOL HCL 50 MG PO TABS: 50.0000 mg | ORAL_TABLET | Freq: Four times a day (QID) | ORAL | Status: DC | PRN

## 2015-11-23 MED ORDER — CEFAZOLIN SODIUM-DEXTROSE 2-4 GM/100ML-% IV SOLN
2.0000 g | Freq: Four times a day (QID) | INTRAVENOUS | Status: AC
  Administered 2015-11-23 (×2): 2 g via INTRAVENOUS
  Filled 2015-11-23 (×2): qty 100

## 2015-11-23 MED ORDER — METHOCARBAMOL 500 MG PO TABS
500.0000 mg | ORAL_TABLET | Freq: Four times a day (QID) | ORAL | Status: DC | PRN
  Administered 2015-11-23 – 2015-11-24 (×3): 500 mg via ORAL
  Filled 2015-11-23 (×2): qty 1

## 2015-11-23 MED ORDER — TRANEXAMIC ACID 1000 MG/10ML IV SOLN
1000.0000 mg | INTRAVENOUS | Status: AC
  Administered 2015-11-23: 1000 mg via INTRAVENOUS
  Filled 2015-11-23: qty 10

## 2015-11-23 MED ORDER — MENTHOL 3 MG MT LOZG: 1.0000 | LOZENGE | OROMUCOSAL | Status: DC | PRN

## 2015-11-23 MED ORDER — DOCUSATE SODIUM 100 MG PO CAPS
100.0000 mg | ORAL_CAPSULE | Freq: Two times a day (BID) | ORAL | Status: DC
Start: 2015-11-23 — End: 2015-11-25
  Administered 2015-11-23 – 2015-11-25 (×4): 100 mg via ORAL
  Filled 2015-11-23 (×4): qty 1

## 2015-11-23 MED ORDER — LACTATED RINGERS IV SOLN
INTRAVENOUS | Status: DC
  Administered 2015-11-23: 12:00:00 via INTRAVENOUS
  Administered 2015-11-23: 1000 mL via INTRAVENOUS

## 2015-11-23 MED ORDER — PROPOFOL 10 MG/ML IV BOLUS
INTRAVENOUS | Status: AC
  Filled 2015-11-23: qty 60

## 2015-11-23 MED ORDER — LORATADINE 10 MG PO TABS
10.0000 mg | ORAL_TABLET | Freq: Every day | ORAL | Status: DC
  Filled 2015-11-23: qty 1

## 2015-11-23 MED ORDER — SODIUM CHLORIDE 0.9 % IJ SOLN
INTRAMUSCULAR | Status: DC | PRN
  Administered 2015-11-23: 30 mL

## 2015-11-23 MED ORDER — POLYETHYLENE GLYCOL 3350 17 G PO PACK: 17.0000 g | PACK | Freq: Every day | ORAL | Status: DC | PRN

## 2015-11-23 MED ORDER — FLEET ENEMA 7-19 GM/118ML RE ENEM: 1.0000 | ENEMA | Freq: Once | RECTAL | Status: DC | PRN

## 2015-11-23 MED ORDER — ONDANSETRON HCL 4 MG/2ML IJ SOLN
INTRAMUSCULAR | Status: DC | PRN
  Administered 2015-11-23: 4 mg via INTRAVENOUS

## 2015-11-23 MED ORDER — BISACODYL 10 MG RE SUPP
10.0000 mg | Freq: Every day | RECTAL | Status: DC | PRN
Start: 2015-11-23 — End: 2015-11-25

## 2015-11-23 MED ORDER — SODIUM CHLORIDE 0.9 % IV SOLN
INTRAVENOUS | Status: DC
  Administered 2015-11-23: 100 mL/h via INTRAVENOUS

## 2015-11-23 MED ORDER — MORPHINE SULFATE (PF) 2 MG/ML IV SOLN: 1.0000 mg | INTRAVENOUS | Status: DC | PRN

## 2015-11-23 MED ORDER — DEXAMETHASONE SODIUM PHOSPHATE 10 MG/ML IJ SOLN
10.0000 mg | Freq: Once | INTRAMUSCULAR | Status: AC
  Administered 2015-11-24: 10 mg via INTRAVENOUS
  Filled 2015-11-23: qty 1

## 2015-11-23 MED ORDER — CHLORHEXIDINE GLUCONATE 4 % EX LIQD: 60.0000 mL | Freq: Once | CUTANEOUS | Status: DC

## 2015-11-23 MED ORDER — METOCLOPRAMIDE HCL 5 MG/ML IJ SOLN: 5.0000 mg | Freq: Three times a day (TID) | INTRAMUSCULAR | Status: DC | PRN

## 2015-11-23 MED ORDER — PHENYLEPHRINE HCL 10 MG/ML IJ SOLN
INTRAMUSCULAR | Status: DC | PRN
  Administered 2015-11-23 (×4): 80 ug via INTRAVENOUS

## 2015-11-23 MED ORDER — ACETAMINOPHEN 325 MG PO TABS: 650.0000 mg | ORAL_TABLET | Freq: Four times a day (QID) | ORAL | Status: DC | PRN

## 2015-11-23 MED ORDER — ONDANSETRON HCL 4 MG PO TABS: 4.0000 mg | ORAL_TABLET | Freq: Four times a day (QID) | ORAL | Status: DC | PRN

## 2015-11-23 MED ORDER — MIDAZOLAM HCL 5 MG/5ML IJ SOLN
INTRAMUSCULAR | Status: DC | PRN
  Administered 2015-11-23: 2 mg via INTRAVENOUS

## 2015-11-23 MED ORDER — BUPIVACAINE HCL 0.25 % IJ SOLN
INTRAMUSCULAR | Status: DC | PRN
  Administered 2015-11-23: 20 mL

## 2015-11-23 MED ORDER — METOCLOPRAMIDE HCL 5 MG PO TABS: 5.0000 mg | ORAL_TABLET | Freq: Three times a day (TID) | ORAL | Status: DC | PRN

## 2015-11-23 MED ORDER — DIPHENHYDRAMINE HCL 12.5 MG/5ML PO ELIX: 12.5000 mg | ORAL_SOLUTION | ORAL | Status: DC | PRN

## 2015-11-23 MED ORDER — IRBESARTAN 150 MG PO TABS
150.0000 mg | ORAL_TABLET | Freq: Every day | ORAL | Status: DC
  Filled 2015-11-23 (×2): qty 1

## 2015-11-23 MED ORDER — MIDAZOLAM HCL 2 MG/2ML IJ SOLN
INTRAMUSCULAR | Status: AC
  Filled 2015-11-23: qty 2

## 2015-11-23 MED ORDER — PROPOFOL 10 MG/ML IV BOLUS
INTRAVENOUS | Status: DC | PRN
  Administered 2015-11-23: 10 mg via INTRAVENOUS

## 2015-11-23 MED ORDER — CEFAZOLIN SODIUM-DEXTROSE 2-4 GM/100ML-% IV SOLN
INTRAVENOUS | Status: AC
  Filled 2015-11-23: qty 100

## 2015-11-23 MED ORDER — ONDANSETRON HCL 4 MG/2ML IJ SOLN
INTRAMUSCULAR | Status: AC
  Filled 2015-11-23: qty 2

## 2015-11-23 MED ORDER — ACETAMINOPHEN 500 MG PO TABS
1000.0000 mg | ORAL_TABLET | Freq: Four times a day (QID) | ORAL | Status: AC
  Administered 2015-11-23 – 2015-11-24 (×3): 1000 mg via ORAL
  Filled 2015-11-23 (×3): qty 2

## 2015-11-23 MED ORDER — ACETAMINOPHEN 10 MG/ML IV SOLN
1000.0000 mg | Freq: Once | INTRAVENOUS | Status: AC
  Administered 2015-11-23: 1000 mg via INTRAVENOUS

## 2015-11-23 MED ORDER — BUPIVACAINE IN DEXTROSE 0.75-8.25 % IT SOLN
INTRATHECAL | Status: DC | PRN
  Administered 2015-11-23: 2 mL via INTRATHECAL

## 2015-11-23 MED ORDER — DEXTROSE 5 % IV SOLN
500.0000 mg | Freq: Four times a day (QID) | INTRAVENOUS | Status: DC | PRN
  Filled 2015-11-23: qty 5

## 2015-11-23 MED ORDER — ONDANSETRON HCL 4 MG/2ML IJ SOLN: 4.0000 mg | Freq: Four times a day (QID) | INTRAMUSCULAR | Status: DC | PRN

## 2015-11-23 MED ORDER — BUPIVACAINE LIPOSOME 1.3 % IJ SUSP
20.0000 mL | Freq: Once | INTRAMUSCULAR | Status: DC
  Filled 2015-11-23: qty 20

## 2015-11-23 MED ORDER — BUPIVACAINE HCL (PF) 0.25 % IJ SOLN
INTRAMUSCULAR | Status: AC
  Filled 2015-11-23: qty 30

## 2015-11-23 MED ORDER — RIVAROXABAN 10 MG PO TABS
10.0000 mg | ORAL_TABLET | Freq: Every day | ORAL | Status: DC
  Administered 2015-11-24 – 2015-11-25 (×2): 10 mg via ORAL
  Filled 2015-11-23: qty 1

## 2015-11-23 MED ORDER — HYDROMORPHONE HCL 1 MG/ML IJ SOLN: 0.2500 mg | INTRAMUSCULAR | Status: DC | PRN

## 2015-11-23 MED ORDER — 0.9 % SODIUM CHLORIDE (POUR BTL) OPTIME
TOPICAL | Status: DC | PRN
  Administered 2015-11-23: 1000 mL

## 2015-11-23 MED ORDER — HYDROMORPHONE HCL 2 MG PO TABS
2.0000 mg | ORAL_TABLET | ORAL | Status: DC | PRN
  Administered 2015-11-23 – 2015-11-24 (×5): 2 mg via ORAL
  Administered 2015-11-24: 4 mg via ORAL
  Administered 2015-11-24 (×2): 2 mg via ORAL
  Administered 2015-11-24 – 2015-11-25 (×3): 4 mg via ORAL
  Filled 2015-11-23 (×3): qty 1
  Filled 2015-11-23 (×2): qty 2
  Filled 2015-11-23 (×2): qty 1
  Filled 2015-11-23: qty 2
  Filled 2015-11-23 (×3): qty 1
  Filled 2015-11-23: qty 2

## 2015-11-23 SURGICAL SUPPLY — 51 items
BAG DECANTER FOR FLEXI CONT (MISCELLANEOUS) ×1 IMPLANT
BAG SPEC THK2 15X12 ZIP CLS (MISCELLANEOUS)
BAG ZIPLOCK 12X15 (MISCELLANEOUS) ×1 IMPLANT
BANDAGE ACE 6X5 VEL STRL LF (GAUZE/BANDAGES/DRESSINGS) ×3 IMPLANT
BLADE SAG 18X100X1.27 (BLADE) ×3 IMPLANT
BLADE SAW SGTL 11.0X1.19X90.0M (BLADE) ×3 IMPLANT
BOWL SMART MIX CTS (DISPOSABLE) ×3 IMPLANT
CAPT KNEE TOTAL 3 ATTUNE ×2 IMPLANT
CEMENT HV SMART SET (Cement) ×6 IMPLANT
CLOSURE WOUND 1/2 X4 (GAUZE/BANDAGES/DRESSINGS) ×2
CLOTH BEACON ORANGE TIMEOUT ST (SAFETY) ×3 IMPLANT
CUFF TOURN SGL QUICK 34 (TOURNIQUET CUFF) ×3
CUFF TRNQT CYL 34X4X40X1 (TOURNIQUET CUFF) ×1 IMPLANT
DECANTER SPIKE VIAL GLASS SM (MISCELLANEOUS) ×3 IMPLANT
DRAPE U-SHAPE 47X51 STRL (DRAPES) ×3 IMPLANT
DRSG ADAPTIC 3X8 NADH LF (GAUZE/BANDAGES/DRESSINGS) ×3 IMPLANT
DRSG PAD ABDOMINAL 8X10 ST (GAUZE/BANDAGES/DRESSINGS) ×3 IMPLANT
DURAPREP 26ML APPLICATOR (WOUND CARE) ×3 IMPLANT
ELECT REM PT RETURN 9FT ADLT (ELECTROSURGICAL) ×3
ELECTRODE REM PT RTRN 9FT ADLT (ELECTROSURGICAL) ×1 IMPLANT
EVACUATOR 1/8 PVC DRAIN (DRAIN) ×3 IMPLANT
GAUZE SPONGE 4X4 12PLY STRL (GAUZE/BANDAGES/DRESSINGS) ×3 IMPLANT
GLOVE BIO SURGEON STRL SZ7.5 (GLOVE) ×12 IMPLANT
GLOVE BIO SURGEON STRL SZ8 (GLOVE) ×3 IMPLANT
GLOVE BIOGEL PI IND STRL 6.5 (GLOVE) IMPLANT
GLOVE BIOGEL PI IND STRL 8 (GLOVE) ×1 IMPLANT
GLOVE BIOGEL PI INDICATOR 6.5 (GLOVE)
GLOVE BIOGEL PI INDICATOR 8 (GLOVE) ×2
GLOVE SURG SS PI 6.5 STRL IVOR (GLOVE) IMPLANT
GOWN STRL REUS W/TWL LRG LVL3 (GOWN DISPOSABLE) ×3 IMPLANT
GOWN STRL REUS W/TWL XL LVL3 (GOWN DISPOSABLE) ×6 IMPLANT
HANDPIECE INTERPULSE COAX TIP (DISPOSABLE) ×3
IMMOBILIZER KNEE 20 (SOFTGOODS) ×3
IMMOBILIZER KNEE 20 THIGH 36 (SOFTGOODS) ×1 IMPLANT
MANIFOLD NEPTUNE II (INSTRUMENTS) ×3 IMPLANT
NS IRRIG 1000ML POUR BTL (IV SOLUTION) ×3 IMPLANT
PACK TOTAL KNEE CUSTOM (KITS) ×3 IMPLANT
PADDING CAST COTTON 6X4 STRL (CAST SUPPLIES) ×9 IMPLANT
POSITIONER SURGICAL ARM (MISCELLANEOUS) ×3 IMPLANT
SET HNDPC FAN SPRY TIP SCT (DISPOSABLE) ×1 IMPLANT
STRIP CLOSURE SKIN 1/2X4 (GAUZE/BANDAGES/DRESSINGS) ×4 IMPLANT
SUT MNCRL AB 4-0 PS2 18 (SUTURE) ×3 IMPLANT
SUT VIC AB 2-0 CT1 27 (SUTURE) ×9
SUT VIC AB 2-0 CT1 TAPERPNT 27 (SUTURE) ×3 IMPLANT
SUT VLOC 180 0 24IN GS25 (SUTURE) ×3 IMPLANT
SYR 50ML LL SCALE MARK (SYRINGE) ×3 IMPLANT
TRAY FOLEY W/METER SILVER 14FR (SET/KITS/TRAYS/PACK) ×1 IMPLANT
TRAY FOLEY W/METER SILVER 16FR (SET/KITS/TRAYS/PACK) ×3 IMPLANT
WATER STERILE IRR 1500ML POUR (IV SOLUTION) ×3 IMPLANT
WRAP KNEE MAXI GEL POST OP (GAUZE/BANDAGES/DRESSINGS) ×3 IMPLANT
YANKAUER SUCT BULB TIP 10FT TU (MISCELLANEOUS) ×3 IMPLANT

## 2015-11-23 NOTE — Transfer of Care (Signed)
Immediate Anesthesia Transfer of Care Note  Patient: Derek Olson  Procedure(s) Performed: Procedure(s): RIGHT TOTAL KNEE ARTHROPLASTY (Right)  Patient Location: PACU  Anesthesia Type:Spinal  Level of Consciousness:  sedated, patient cooperative and responds to stimulation  Airway & Oxygen Therapy:Patient Spontanous Breathing and Patient connected to face mask oxgen  Post-op Assessment:  Report given to PACU RN and Post -op Vital signs reviewed and stable  Post vital signs:  Reviewed and stable, spinal L1  Last Vitals:  Vitals:   11/23/15 0825  BP: (!) 135/92  Pulse: (!) 56  Resp: 18  Temp: [id number] C    Complications: No apparent anesthesia complications

## 2015-11-23 NOTE — Anesthesia Procedure Notes (Signed)
Spinal  Patient location during procedure: OR Start time: 11/23/2015 11:24 AM End time: 11/23/2015 11:29 AM Staffing Anesthesiologist: Roderic Palau Resident/CRNA: Darlys Gales R Performed: resident/CRNA  Preanesthetic Checklist Completed: patient identified, site marked, surgical consent, pre-op evaluation, timeout performed, IV checked, risks and benefits discussed and monitors and equipment checked Spinal Block Patient position: sitting Prep: ChloraPrep Patient monitoring: heart rate, continuous pulse ox and blood pressure Approach: midline Location: L3-4 Injection technique: single-shot Needle Needle type: Spinocan  Needle gauge: 24 G Needle length: 9 cm Needle insertion depth: 7 cm Assessment Sensory level: T6 Additional Notes Expiration date of kit checked and confirmed. Patient tolerated procedure well, without complications.

## 2015-11-23 NOTE — H&P (View-Only) (Signed)
Derek Olson DOB: 31-May-1955 Married / Language: English / Race: White Male Date of Admission:  11/23/2015 CC:  Right Knee Pain History of Present Illness The patient is a 60 year old male who comes in for a preoperative History and Physical. The patient is scheduled for a right total knee arthroplasty to be performed by Dr. Dione Plover. Aluisio, MD at [facility] on 11/23/2015.. The patient is a 60 year old male who presented for follow up of their knee. The patient is being followed for their right knee pain and osteoarthritis. They are now month(s) out from Synvisc series. Symptoms reported include: pain, swelling, aching and popping (occasionally). The patient feels that they are doing poorly and report their pain level to be mild to moderate. Current treatment includes: home exercise program and activity modification. The following medication has been used for pain control: antiinflammatory medication (ibuprofen, after exercise). The patient has not gotten any relief of their symptoms with viscosupplementation (very minimal help). The patient indicates that they have questions or concerns today regarding pain, activity, their progress at this point and want to discuss surgery. Unfortunately his right knee is getting progressively worse over time. The viscosupplements did not provide a tremendous amount of benefit. His condition is getting progressively worse. The right knee is limiting what he can and cannot do. He would like to be more active, but the knee is preventing him from doing so. The pain is occurring mostly with activities, but even occurs at rest. He is now ready to proceed with surical intervention. They have been treated conservatively in the past for the above stated problem and despite conservative measures, they continue to have progressive pain and severe functional limitations and dysfunction. They have failed non-operative management including home exercise, medications, and  injections. It is felt that they would benefit from undergoing total joint replacement. Risks and benefits of the procedure have been discussed with the patient and they elect to proceed with surgery. There are no active contraindications to surgery such as ongoing infection or rapidly progressive neurological disease.  Problem List/Past Medical  Primary osteoarthritis of right knee (M17.11)  High blood pressure  Skin Cancer  Hemorrhoids Impaired Fasting Glucose Globus Phenomenon Insomnia  Allergies HYDROCODONE  Itching.  Family History Osteoarthritis  mother Rheumatoid Arthritis  mother Cancer  mother  Social History Drug/Alcohol Rehab (Previously)  no Exercise  Exercises rarely; does other Illicit drug use  no Children  2 Current work status  working full time Engineer, agricultural (Currently)  no Living situation  live alone Tobacco / smoke exposure  yes outdoors only Tobacco use  never smoker Marital status  divorced Number of flights of stairs before winded  4-5 Pain Contract  no Alcohol use  never consumed alcohol  Medication History Benicar (20MG  Tablet, Oral) Active. Loratadine (10MG  Tablet, Oral) Active. Multiple Vitamin (1 (one) Oral) Active. Super B Complex Active. Ibuprofen Active.  Past Surgical History Sinus Surgery  Straighten Nasal Septum  Vasectomy  Arthroscopy of Knee  right Arthroscopy of Shoulder  bilateral Rotator Cuff Repair  bilateral  Review of Systems General Not Present- Chills, Fatigue, Fever, Memory Loss, Night Sweats, Weight Gain and Weight Loss. Skin Not Present- Eczema, Hives, Itching, Lesions and Rash. HEENT Not Present- Dentures, Double Vision, Headache, Hearing Loss, Tinnitus and Visual Loss. Respiratory Not Present- Allergies, Chronic Cough, Coughing up blood, Shortness of breath at rest and Shortness of breath with exertion. Cardiovascular Not Present- Chest Pain, Difficulty Breathing Lying Down,  Murmur, Palpitations,  Racing/skipping heartbeats and Swelling. Gastrointestinal Not Present- Abdominal Pain, Bloody Stool, Constipation, Diarrhea, Difficulty Swallowing, Heartburn, Jaundice, Loss of appetitie, Nausea and Vomiting. Male Genitourinary Not Present- Blood in Urine, Discharge, Flank Pain, Incontinence, Painful Urination, Urgency, Urinary frequency, Urinary Retention, Urinating at Night and Weak urinary stream. Musculoskeletal Present- Joint Pain, Joint Swelling and Morning Stiffness. Not Present- Back Pain, Muscle Pain, Muscle Weakness and Spasms. Neurological Not Present- Blackout spells, Difficulty with balance, Dizziness, Paralysis, Tremor and Weakness. Psychiatric Not Present- Insomnia.  Vitals Weight: 204 lb Height: 69in Weight was reported by patient. Height was reported by patient. Body Surface Area: 2.08 m Body Mass Index: 30.13 kg/m  Pulse: 64 (Regular)  BP: 138/88 (Sitting, Right Arm, Standard)   Physical Exam General Mental Status -Alert, cooperative and good historian. General Appearance-pleasant, Not in acute distress. Orientation-Oriented X3. Build & Nutrition-Well nourished and Well developed.  Head and Neck Head-normocephalic, atraumatic . Neck Global Assessment - supple, no bruit auscultated on the right, no bruit auscultated on the left.  Eye Vision-Wears corrective lenses. Pupil - Bilateral-Regular and Round. Motion - Bilateral-EOMI.  Chest and Lung Exam Auscultation Breath sounds - clear at anterior chest wall and clear at posterior chest wall. Adventitious sounds - No Adventitious sounds.  Cardiovascular Auscultation Rhythm - Regular rate and rhythm. Heart Sounds - S1 WNL and S2 WNL. Murmurs & Other Heart Sounds - Auscultation of the heart reveals - No Murmurs.  Abdomen Palpation/Percussion Tenderness - Abdomen is non-tender to palpation. Rigidity (guarding) - Abdomen is soft. Auscultation Auscultation of the  abdomen reveals - Bowel sounds normal.  Male Genitourinary Note: Not done, not pertinent to present illness   Musculoskeletal Note: He is alert and oriented, in no apparent distress. Evaluation of his right hip shows that he has normal range of motion without discomfort. His right knee shows no effusion. His range of motion of the right knee is about 5 to 125. There was marked crepitus on range of motion. He does have significant tenderness medially. There is no lateral tenderness noted or instability. His left knee exam is unremarkable.  RADIOGRAPHS Reviewed AP both knees and lateral and he has got bone on bone arthritis in the medial and patellofemoral compartments of the right knee.  Assessment & Plan Primary osteoarthritis of right knee (M17.11)  Note:Surgical Plans: Right Total Knee Replacement  Disposition: Home  PCP: Dr. Willey Blade - Patient has been seen preoperatively and felt to be stable for surgery.  IV TXA  Anesthesia Issues: None except nausea one time  Signed electronically by Ok Edwards, III PA-C

## 2015-11-23 NOTE — Interval H&P Note (Signed)
History and Physical Interval Note:  11/23/2015 10:23 AM  Derek Olson  has presented today for surgery, with the diagnosis of RIGHT KNEE OA  The various methods of treatment have been discussed with the patient and family. After consideration of risks, benefits and other options for treatment, the patient has consented to  Procedure(s): RIGHT TOTAL KNEE ARTHROPLASTY (Right) as a surgical intervention .  The patient's history has been reviewed, patient examined, no change in status, stable for surgery.  I have reviewed the patient's chart and labs.  Questions were answered to the patient's satisfaction.     Gearlean Alf

## 2015-11-23 NOTE — Anesthesia Postprocedure Evaluation (Signed)
Anesthesia Post Note  Patient: Derek Olson  Procedure(s) Performed: Procedure(s) (LRB): RIGHT TOTAL KNEE ARTHROPLASTY (Right)  Patient location during evaluation: PACU Anesthesia Type: Spinal and MAC Level of consciousness: awake and alert Pain management: pain level controlled Vital Signs Assessment: post-procedure vital signs reviewed and stable Respiratory status: spontaneous breathing and respiratory function stable Cardiovascular status: blood pressure returned to baseline and stable Postop Assessment: spinal receding Anesthetic complications: no    Last Vitals:  Vitals:   11/23/15 1315 11/23/15 1330  BP: 117/84 122/90  Pulse: (!) 56 (!) 51  Resp: 14 11  Temp: 36.3 C     Last Pain:  Vitals:   11/23/15 0825  TempSrc: Oral                 Esmirna Ravan,W. EDMOND

## 2015-11-23 NOTE — Anesthesia Preprocedure Evaluation (Addendum)
Anesthesia Evaluation  Patient identified by MRN, date of birth, ID band Patient awake    Reviewed: Allergy & Precautions, H&P , NPO status , Patient's Chart, lab work & pertinent test results  History of Anesthesia Complications (+) PONV  Airway Mallampati: II  TM Distance: >3 FB Neck ROM: Full    Dental no notable dental hx. (+) Teeth Intact, Dental Advisory Given   Pulmonary neg pulmonary ROS,    Pulmonary exam normal breath sounds clear to auscultation       Cardiovascular hypertension, On Medications  Rhythm:Regular Rate:Normal     Neuro/Psych negative neurological ROS  negative psych ROS   GI/Hepatic negative GI ROS, Neg liver ROS,   Endo/Other  negative endocrine ROS  Renal/GU negative Renal ROS  negative genitourinary   Musculoskeletal  (+) Arthritis , Osteoarthritis,    Abdominal   Peds  Hematology negative hematology ROS (+)   Anesthesia Other Findings   Reproductive/Obstetrics negative OB ROS                           Anesthesia Physical Anesthesia Plan  ASA: II  Anesthesia Plan: MAC and Spinal   Post-op Pain Management:    Induction: Intravenous  Airway Management Planned: Simple Face Mask  Additional Equipment:   Intra-op Plan:   Post-operative Plan:   Informed Consent: I have reviewed the patients History and Physical, chart, labs and discussed the procedure including the risks, benefits and alternatives for the proposed anesthesia with the patient or authorized representative who has indicated his/her understanding and acceptance.   Dental advisory given  Plan Discussed with: CRNA  Anesthesia Plan Comments:         Anesthesia Quick Evaluation

## 2015-11-23 NOTE — Op Note (Signed)
Pre-operative diagnosis- Osteoarthritis  Right knee(s)  Post-operative diagnosis- Osteoarthritis Right knee(s)  Procedure-  Right  Total Knee Arthroplasty (Depuy Attune)  Surgeon- Dione Plover. Lucia Mccreadie, MD  Assistant- Ardeen Jourdain, PA-C   Anesthesia-  Spinal  EBL-* No blood loss amount entered *   Drains Hemovac  Tourniquet time- 33 minutes @ [id number] mm Hg    Complications- None  Condition-PACU - hemodynamically stable.   Brief Clinical Note  Derek Olson is a 60 y.o. year old male with end stage OA of his right knee with progressively worsening pain and dysfunction. He has constant pain, with activity and at rest and significant functional deficits with difficulties even with ADLs. He has had extensive non-op management including analgesics, injections of cortisone, and home exercise program, but remains in significant pain with significant dysfunction. Radiographs show bone on bone arthritis medial and patellofemoral. He presents now for right Total Knee Arthroplasty.    Procedure in detail---   The patient is brought into the operating room and positioned supine on the operating table. After successful administration of  Spinal,   a tourniquet is placed high on the  Right thigh(s) and the lower extremity is prepped and draped in the usual sterile fashion. Time out is performed by the operating team and then the  Right lower extremity is wrapped in Esmarch, knee flexed and the tourniquet inflated to 300 mmHg.       A midline incision is made with a ten blade through the subcutaneous tissue to the level of the extensor mechanism. A fresh blade is used to make a medial parapatellar arthrotomy. Soft tissue over the proximal medial tibia is subperiosteally elevated to the joint line with a knife and into the semimembranosus bursa with a Cobb elevator. Soft tissue over the proximal lateral tibia is elevated with attention being paid to avoiding the patellar tendon on the tibial tubercle. The  patella is everted, knee flexed 90 degrees and the ACL and PCL are removed. Findings are bone on bone medial and patellofemoral with large global osteophytes.        The drill is used to create a starting hole in the distal femur and the canal is thoroughly irrigated with sterile saline to remove the fatty contents. The 5 degree Right  valgus alignment guide is placed into the femoral canal and the distal femoral cutting block is pinned to remove 9 mm off the distal femur. Resection is made with an oscillating saw.      The tibia is subluxed forward and the menisci are removed. The extramedullary alignment guide is placed referencing proximally at the medial aspect of the tibial tubercle and distally along the second metatarsal axis and tibial crest. The block is pinned to remove 67mm off the more deficient medial  side. Resection is made with an oscillating saw. Size 6is the most appropriate size for the tibia and the proximal tibia is prepared with the modular drill and keel punch for that size.      The femoral sizing guide is placed and size 6 is most appropriate. Rotation is marked off the epicondylar axis and confirmed by creating a rectangular flexion gap at 90 degrees. The size 6 cutting block is pinned in this rotation and the anterior, posterior and chamfer cuts are made with the oscillating saw. The intercondylar block is then placed and that cut is made.      Trial size 6 tibial component, trial size 6 posterior stabilized femur and a 8  mm  posterior stabilized rotating platform insert trial is placed. Full extension is achieved with excellent varus/valgus and anterior/posterior balance throughout full range of motion. The patella is everted and thickness measured to be 24  mm. Free hand resection is taken to 14 mm, a 38 template is placed, lug holes are drilled, trial patella is placed, and it tracks normally. Osteophytes are removed off the posterior femur with the trial in place. All trials are  removed and the cut bone surfaces prepared with pulsatile lavage. Cement is mixed and once ready for implantation, the size 6 tibial implant, size  6 posterior stabilized femoral component, and the size 38 patella are cemented in place and the patella is held with the clamp. The trial insert is placed and the knee held in full extension. The Exparel (20 ml mixed with 30 ml saline) and .25% Bupivicaine, are injected into the extensor mechanism, posterior capsule, medial and lateral gutters and subcutaneous tissues.  All extruded cement is removed and once the cement is hard the permanent 8 mm posterior stabilized rotating platform insert is placed into the tibial tray.      The wound is copiously irrigated with saline solution and the extensor mechanism closed over a hemovac drain with #1 V-loc suture. The tourniquet is released for a total tourniquet time of 33  minutes. Flexion against gravity is 140 degrees and the patella tracks normally. Subcutaneous tissue is closed with 2.0 vicryl and subcuticular with running 4.0 Monocryl. The incision is cleaned and dried and steri-strips and a bulky sterile dressing are applied. The limb is placed into a knee immobilizer and the patient is awakened and transported to recovery in stable condition.      Please note that a surgical assistant was a medical necessity for this procedure in order to perform it in a safe and expeditious manner. Surgical assistant was necessary to retract the ligaments and vital neurovascular structures to prevent injury to them and also necessary for proper positioning of the limb to allow for anatomic placement of the prosthesis.   Dione Plover Calandra Madura, MD    11/23/2015, 12:24 PM

## 2015-11-24 LAB — CBC
HCT: 33.1 % — ABNORMAL LOW (ref 39.0–52.0)
HEMOGLOBIN: 12 g/dL — AB (ref 13.0–17.0)
MCH: 32.3 pg (ref 26.0–34.0)
MCHC: 36.3 g/dL — AB (ref 30.0–36.0)
MCV: 89 fL (ref 78.0–100.0)
Platelets: 162 10*3/uL (ref 150–400)
RBC: 3.72 MIL/uL — AB (ref 4.22–5.81)
RDW: 13 % (ref 11.5–15.5)
WBC: 13.8 10*3/uL — ABNORMAL HIGH (ref 4.0–10.5)

## 2015-11-24 LAB — BASIC METABOLIC PANEL
Anion gap: 6 (ref 5–15)
BUN: 13 mg/dL (ref 6–20)
CHLORIDE: 109 mmol/L (ref 101–111)
CO2: 23 mmol/L (ref 22–32)
CREATININE: 0.55 mg/dL — AB (ref 0.61–1.24)
Calcium: 8.7 mg/dL — ABNORMAL LOW (ref 8.9–10.3)
GFR calc Af Amer: >60 mL/min (ref >60)
GFR calc non Af Amer: >60 mL/min (ref >60)
GLUCOSE: 172 mg/dL — AB (ref 65–99)
POTASSIUM: 4 mmol/L (ref 3.5–5.1)
SODIUM: 138 mmol/L (ref 135–145)

## 2015-11-24 MED ORDER — HYDROMORPHONE HCL 2 MG PO TABS
2.0000 mg | ORAL_TABLET | ORAL | 0 refills | Status: DC | PRN
Start: 2015-11-24 — End: 2020-01-01

## 2015-11-24 MED ORDER — TRAMADOL HCL 50 MG PO TABS: 50.0000 mg | ORAL_TABLET | Freq: Four times a day (QID) | ORAL | 1 refills | Status: DC | PRN

## 2015-11-24 MED ORDER — RIVAROXABAN 10 MG PO TABS: 10.0000 mg | ORAL_TABLET | Freq: Every day | ORAL | 0 refills | Status: DC

## 2015-11-24 MED ORDER — METHOCARBAMOL 500 MG PO TABS: 500.0000 mg | ORAL_TABLET | Freq: Four times a day (QID) | ORAL | 0 refills | Status: DC | PRN

## 2015-11-24 NOTE — Progress Notes (Signed)
Occupational Therapy Evaluation Patient Details Name: Derek Olson MRN: [id number] DOB: 01-16-1956 Today's Date: 11/24/2015    History of Present Illness 60 yo male s/p R TKA 11/23/15   Clinical Impression   All OT education completed and pt questions answered. No further OT needs at this time. Will sign off.    Follow Up Recommendations  No OT follow up;Supervision - Intermittent    Equipment Recommendations  None recommended by OT    Recommendations for Other Services       Precautions / Restrictions Precautions Precautions: Knee Required Braces or Orthoses: Knee Immobilizer - Right Knee Immobilizer - Right: Discontinue once straight leg raise with < 10 degree lag Restrictions Weight Bearing Restrictions: No RLE Weight Bearing: Weight bearing as tolerated      Mobility Bed Mobility            General bed mobility comments: NT -- up in recliner  Transfers Overall transfer level: Needs assistance Equipment used: Rolling walker (2 wheeled) Transfers: Sit to/from Stand Sit to Stand: Supervision             Balance                                            ADL Overall ADL's : Needs assistance/impaired Eating/Feeding: Independent;Sitting   Grooming: Wash/dry hands;Supervision/safety;Standing   Upper Body Bathing: Set up;Sitting   Lower Body Bathing: Minimal assistance;Sit to/from stand   Upper Body Dressing : Set up;Sitting   Lower Body Dressing: Minimal assistance;Sit to/from stand   Toilet Transfer: Supervision/safety;Ambulation;BSC;RW   Toileting- Clothing Manipulation and Hygiene: Supervision/safety;Sit to/from stand   Tub/ Shower Transfer: Walk-in shower;Supervision/safety;Ambulation;Rolling walker;Grab bars;Shower seat   Functional mobility during ADLs: Supervision/safety;Rolling walker General ADL Comments: Patient practiced toilet/shower transfers during session. Educated on safe uses of DME for those transfers.  Patient's wife to assist with LB self-care. All education completed. Will sign off.      Vision     Perception     Praxis      Pertinent Vitals/Pain Pain Assessment: 0-10 Pain Score: 2  Pain Location: R knee Pain Descriptors / Indicators: Aching;Sore Pain Intervention(s): Monitored during session;Repositioned;Ice applied     Hand Dominance     Extremity/Trunk Assessment Upper Extremity Assessment Upper Extremity Assessment: Overall WFL for tasks assessed   Lower Extremity Assessment Lower Extremity Assessment: Defer to PT evaluation       Communication Communication Communication: No difficulties   Cognition Arousal/Alertness: Awake/alert Behavior During Therapy: WFL for tasks assessed/performed Overall Cognitive Status: Within Functional Limits for tasks assessed                     General Comments       Exercises       Shoulder Instructions      Home Living Family/patient expects to be discharged to:: Private residence Living Arrangements: Spouse/significant other Available Help at Discharge: Family Type of Home: House Home Access: Stairs to enter Technical brewer of Steps: 2 Entrance Stairs-Rails: Right Home Layout: One level     Bathroom Shower/Tub: Occupational psychologist: Handicapped height Bathroom Accessibility: Yes How Accessible: Accessible via walker Home Equipment: Arp - 2 wheels;Walker - 4 wheels;Cane - single point;Crutches;Bedside commode;Shower seat - built in;Grab bars - tub/shower          Prior Functioning/Environment Level of Independence: Independent  OT Diagnosis: Acute pain   OT Problem List: Decreased strength;Decreased range of motion;Pain;Decreased knowledge of use of DME or AE;Decreased knowledge of precautions   OT Treatment/Interventions:      OT Goals(Current goals can be found in the care plan section) Acute Rehab OT Goals Patient Stated Goal: regain independence OT  Goal Formulation: All assessment and education complete, DC therapy  OT Frequency:     Barriers to D/C:            Co-evaluation              End of Session Equipment Utilized During Treatment: Rolling walker;Right knee immobilizer Nurse Communication: Mobility status  Activity Tolerance: Patient tolerated treatment well Patient left: in chair;with call bell/phone within reach;with family/visitor present   Time: [id number] OT Time Calculation (min): 14 min Charges:  OT General Charges $OT Visit: 1 Procedure OT Evaluation $OT Eval Low Complexity: 1 Procedure G-Codes:    Alayza Pieper A 12-06-15, 1:49 PM

## 2015-11-24 NOTE — Discharge Summary (Signed)
Physician Discharge Summary   Patient ID: Derek Olson MRN: [id number] DOB/AGE: Dec 12, 1955 60 y.o.  Admit date: 11/23/2015 Discharge date: 11/25/2015  Primary Diagnosis:  Osteoarthritis  Right knee(s)  Admission Diagnoses:  Past Medical History:  Diagnosis Date  . Arthritis   . Cancer (HCC)    hx of skin cancer on nose   . Hypertension   . PONV (postoperative nausea and vomiting)    Discharge Diagnoses:   Active Problems:   OA (osteoarthritis) of knee  Estimated body mass index is 31.01 kg/m as calculated from the following:   Height as of this encounter: _0  (1.753 m).   Weight as of this encounter: 95.3 kg (210 lb).  Procedure:  Procedure(s) (LRB): RIGHT TOTAL KNEE ARTHROPLASTY (Right)   Consults: None  HPI: Derek Olson is a 60 y.o. year old male with end stage OA of his right knee with progressively worsening pain and dysfunction. He has constant pain, with activity and at rest and significant functional deficits with difficulties even with ADLs. He has had extensive non-op management including analgesics, injections of cortisone, and home exercise program, but remains in significant pain with significant dysfunction. Radiographs show bone on bone arthritis medial and patellofemoral. He presents now for right Total Knee Arthroplasty.   Laboratory Data: Admission on 11/23/2015  Component Date Value Ref Range Status  . WBC 11/24/2015 13.8* 4.0 - 10.5 K/uL Final  . RBC 11/24/2015 3.72* 4.22 - 5.81 MIL/uL Final  . Hemoglobin 11/24/2015 12.0* 13.0 - 17.0 g/dL Final  . HCT 11/24/2015 33.1* 39.0 - 52.0 % Final  . MCV 11/24/2015 89.0  78.0 - 100.0 fL Final  . MCH 11/24/2015 32.3  26.0 - 34.0 pg Final  . MCHC 11/24/2015 36.3* 30.0 - 36.0 g/dL Final  . RDW 11/24/2015 13.0  11.5 - 15.5 % Final  . Platelets 11/24/2015 162  150 - 400 K/uL Final  . Sodium 11/24/2015 138  135 - 145 mmol/L Final  . Potassium 11/24/2015 4.0  3.5 - 5.1 mmol/L Final  . Chloride 11/24/2015 109   101 - 111 mmol/L Final  . CO2 11/24/2015 23  22 - 32 mmol/L Final  . Glucose, Bld 11/24/2015 172* 65 - 99 mg/dL Final  . BUN 11/24/2015 13  6 - 20 mg/dL Final  . Creatinine, Ser 11/24/2015 0.55* 0.61 - 1.24 mg/dL Final  . Calcium 11/24/2015 8.7* 8.9 - 10.3 mg/dL Final  . GFR calc non Af Amer 11/24/2015 >60  >60 mL/min Final  . GFR calc Af Amer 11/24/2015 >60  >60 mL/min Final   Comment: (NOTE) The eGFR has been calculated using the CKD EPI equation. This calculation has not been validated in all clinical situations. eGFR's persistently <60 mL/min signify possible Chronic Kidney Disease.   Georgiann Hahn gap 11/24/2015 6  5 - 15 Final  Hospital Outpatient Visit on 11/16/2015  Component Date Value Ref Range Status  . aPTT 11/16/2015 30  24 - 37 seconds Final  . WBC 11/16/2015 5.0  4.0 - 10.5 K/uL Final  . RBC 11/16/2015 4.17* 4.22 - 5.81 MIL/uL Final  . Hemoglobin 11/16/2015 13.5  13.0 - 17.0 g/dL Final  . HCT 11/16/2015 37.5* 39.0 - 52.0 % Final  . MCV 11/16/2015 89.9  78.0 - 100.0 fL Final  . MCH 11/16/2015 32.4  26.0 - 34.0 pg Final  . MCHC 11/16/2015 36.0  30.0 - 36.0 g/dL Final  . RDW 11/16/2015 13.2  11.5 - 15.5 % Final  . Platelets 11/16/2015 187  150 - 400 K/uL Final  . Sodium 11/16/2015 138  135 - 145 mmol/L Final  . Potassium 11/16/2015 4.8  3.5 - 5.1 mmol/L Final  . Chloride 11/16/2015 106  101 - 111 mmol/L Final  . CO2 11/16/2015 28  22 - 32 mmol/L Final  . Glucose, Bld 11/16/2015 100* 65 - 99 mg/dL Final  . BUN 11/16/2015 16  6 - 20 mg/dL Final  . Creatinine, Ser 11/16/2015 0.75  0.61 - 1.24 mg/dL Final  . Calcium 11/16/2015 9.2  8.9 - 10.3 mg/dL Final  . Total Protein 11/16/2015 6.6  6.5 - 8.1 g/dL Final  . Albumin 11/16/2015 4.2  3.5 - 5.0 g/dL Final  . AST 11/16/2015 30  15 - 41 U/L Final  . ALT 11/16/2015 20  17 - 63 U/L Final  . Alkaline Phosphatase 11/16/2015 43  38 - 126 U/L Final  . Total Bilirubin 11/16/2015 1.0  0.3 - 1.2 mg/dL Final  . GFR calc non Af Amer  11/16/2015 >60  >60 mL/min Final  . GFR calc Af Amer 11/16/2015 >60  >60 mL/min Final   Comment: (NOTE) The eGFR has been calculated using the CKD EPI equation. This calculation has not been validated in all clinical situations. eGFR's persistently <60 mL/min signify possible Chronic Kidney Disease.   . Anion gap 11/16/2015 4* 5 - 15 Final  . Prothrombin Time 11/16/2015 13.9  11.6 - 15.2 seconds Final  . INR 11/16/2015 1.10  0.00 - 1.49 Final  . ABO/RH(D) 11/16/2015 A POS   Final  . Antibody Screen 11/16/2015 NEG   Final  . Sample Expiration 11/16/2015 11/30/2015   Final  . Extend sample reason 11/16/2015 NO TRANSFUSIONS OR PREGNANCY IN THE PAST 3 MONTHS   Final  . Color, Urine 11/16/2015 YELLOW  YELLOW Final  . APPearance 11/16/2015 CLEAR  CLEAR Final  . Specific Gravity, Urine 11/16/2015 1.012  1.005 - 1.030 Final  . pH 11/16/2015 6.5  5.0 - 8.0 Final  . Glucose, UA 11/16/2015 NEGATIVE  NEGATIVE mg/dL Final  . Hgb urine dipstick 11/16/2015 NEGATIVE  NEGATIVE Final  . Bilirubin Urine 11/16/2015 NEGATIVE  NEGATIVE Final  . Ketones, ur 11/16/2015 NEGATIVE  NEGATIVE mg/dL Final  . Protein, ur 11/16/2015 NEGATIVE  NEGATIVE mg/dL Final  . Nitrite 11/16/2015 NEGATIVE  NEGATIVE Final  . Leukocytes, UA 11/16/2015 NEGATIVE  NEGATIVE Final  . MRSA, PCR 11/16/2015 NEGATIVE  NEGATIVE Final  . Staphylococcus aureus 11/16/2015 NEGATIVE  NEGATIVE Final   Comment:        The Xpert SA Assay (FDA approved for NASAL specimens in patients over 91 years of age), is one component of a comprehensive surveillance program.  Test performance has been validated by [facility] for patients greater than or equal to 33 year old. It is not intended to diagnose infection nor to guide or monitor treatment.   . ABO/RH(D) 11/16/2015 A POS   Final     X-Rays:No results found.  EKG: Orders placed or performed during the hospital encounter of 04/08/14  . EKG 12-Lead  . EKG 12-Lead     Hospital Course:  Derek Olson is a 60 y.o. who was admitted to [facility]. They were brought to the operating room on 11/23/2015 and underwent Procedure(s): RIGHT TOTAL KNEE ARTHROPLASTY.  Patient tolerated the procedure well and was later transferred to the recovery room and then to the orthopaedic floor for postoperative care.  They were given PO and IV analgesics for pain control following their surgery.  They were given 24 hours of postoperative antibiotics of  Anti-infectives    Start     Dose/Rate Route Frequency Ordered Stop   11/23/15 1800  ceFAZolin (ANCEF) IVPB 2g/100 mL premix     2 g 200 mL/hr over 30 Minutes Intravenous Every 6 hours 11/23/15 1501 11/24/15 0002   11/23/15 0826  ceFAZolin (ANCEF) IVPB 2g/100 mL premix     2 g 200 mL/hr over 30 Minutes Intravenous On call to O.R. 11/23/15 [id number] 11/23/15 1131     and started on DVT prophylaxis in the form of Xarelto.   PT and OT were ordered for total joint protocol.  Discharge planning consulted to help with postop disposition and equipment needs.  Patient had a decent night on the evening of surgery.  They started to get up OOB with therapy on day one. Hemovac drain was pulled without difficulty.  Continued to work with therapy into day two.  Dressing was changed on day two and the incision was healing well.  Patient was seen in rounds on day two and was ready to go home.  Diet - Cardiac diet Follow up - in 2 weeks Activity - WBAT Disposition - Home Condition Upon Discharge - Good D/C Meds - See DC Summary DVT Prophylaxis - Xarelto  Discharge Instructions    Call MD / Call 911    Complete by:  As directed   If you experience chest pain or shortness of breath, CALL 911 and be transported to the hospital emergency room.  If you develope a fever above 101 F, pus (white drainage) or increased drainage or redness at the wound, or calf pain, call your surgeon's office.   Change dressing    Complete by:  As directed   Change dressing daily  with sterile 4 x 4 inch gauze dressing and apply TED hose. Do not submerge the incision under water.   Constipation Prevention    Complete by:  As directed   Drink plenty of fluids.  Prune juice may be helpful.  You may use a stool softener, such as Colace (over the counter) 100 mg twice a day.  Use MiraLax (over the counter) for constipation as needed.   Diet - low sodium heart healthy    Complete by:  As directed   Discharge instructions    Complete by:  As directed   Pick up stool softner and laxative for home use following surgery while on pain medications. Do not submerge incision under water. Please use good hand washing techniques while changing dressing each day. May shower starting three days after surgery. Please use a clean towel to pat the incision dry following showers. Continue to use ice for pain and swelling after surgery. Do not use any lotions or creams on the incision until instructed by your surgeon.   Postoperative Constipation Protocol  Constipation - defined medically as fewer than three stools per week and severe constipation as less than one stool per week.  One of the most common issues patients have following surgery is constipation.  Even if you have a regular bowel pattern at home, your normal regimen is likely to be disrupted due to multiple reasons following surgery.  Combination of anesthesia, postoperative narcotics, change in appetite and fluid intake all can affect your bowels.  In order to avoid complications following surgery, here are some recommendations in order to help you during your recovery period.  Colace (docusate) - Pick up an over-the-counter form of Colace or another stool softener  and take twice a day as long as you are requiring postoperative pain medications.  Take with a full glass of water daily.  If you experience loose stools or diarrhea, hold the colace until you stool forms back up.  If your symptoms do not get better within 1 week or if  they get worse, check with your doctor.  Dulcolax (bisacodyl) - Pick up over-the-counter and take as directed by the product packaging as needed to assist with the movement of your bowels.  Take with a full glass of water.  Use this product as needed if not relieved by Colace only.   MiraLax (polyethylene glycol) - Pick up over-the-counter to have on hand.  MiraLax is a solution that will increase the amount of water in your bowels to assist with bowel movements.  Take as directed and can mix with a glass of water, juice, soda, coffee, or tea.  Take if you go more than two days without a movement. Do not use MiraLax more than once per day. Call your doctor if you are still constipated or irregular after using this medication for 7 days in a row.  If you continue to have problems with postoperative constipation, please contact the office for further assistance and recommendations.  If you experience "the worst abdominal pain ever" or develop nausea or vomiting, please contact the office immediatly for further recommendations for treatment.   Take Xarelto for two and a half more weeks, then discontinue Xarelto.   Do not put a pillow under the knee. Place it under the heel.    Complete by:  As directed   Do not sit on low chairs, stoools or toilet seats, as it may be difficult to get up from low surfaces    Complete by:  As directed   Driving restrictions    Complete by:  As directed   No driving until released by the physician.   Increase activity slowly as tolerated    Complete by:  As directed   Lifting restrictions    Complete by:  As directed   No lifting until released by the physician.   Patient may shower    Complete by:  As directed   You may shower without a dressing once there is no drainage.  Do not wash over the wound.  If drainage remains, do not shower until drainage stops.   TED hose    Complete by:  As directed   Use stockings (TED hose) for 3 weeks on both leg(s).  You may remove  them at night for sleeping.   Weight bearing as tolerated    Complete by:  As directed       Medication List    STOP taking these medications   CLA PO   CO Q 10 PO   FISH OIL PO   FLAX SEED OIL PO   ICAPS PO   multivitamin with minerals Tabs tablet   naproxen sodium 220 MG tablet Commonly known as:  ANAPROX   VITAMIN B-COMPLEX PO   ZINC PO     TAKE these medications   HYDROmorphone 2 MG tablet Commonly known as:  DILAUDID Take 1-2 tablets (2-4 mg total) by mouth every 4 (four) hours as needed for moderate pain or severe pain.   loratadine 10 MG tablet Commonly known as:  CLARITIN Take 10 mg by mouth daily.   MAGNESIUM PO Take 1 tablet by mouth daily.   methocarbamol 500 MG tablet Commonly known as:  ROBAXIN  Take 1 tablet (500 mg total) by mouth every 6 (six) hours as needed for muscle spasms.   olmesartan 20 MG tablet Commonly known as:  BENICAR Take 20 mg by mouth daily.   rivaroxaban 10 MG Tabs tablet Commonly known as:  XARELTO Take 1 tablet (10 mg total) by mouth daily with breakfast. Take Xarelto for two and a half more weeks, then discontinue Xarelto. Once the patient has completed the blood thinner regimen, then take a Baby 81 mg Aspirin daily for three more weeks.   traMADol 50 MG tablet Commonly known as:  ULTRAM Take 1-2 tablets (50-100 mg total) by mouth every 6 (six) hours as needed (mild pain).      Follow-up Information    [facility] .   Why:  You wil receive a call from [facility] for home health physical therapy. Contact information: [phone]       Gearlean Alf, MD Follow up on 12/08/2015.   Specialty:  Orthopedic Surgery Why:  Call office at [phone] to setup appointment with Dr. Anne Fu staff. Contact information: [address] [place] [address] [phone]           Signed: Arlee Muslim, PA-C Orthopaedic Surgery 11/24/2015, 10:39 PM

## 2015-11-24 NOTE — Progress Notes (Signed)
   Subjective: 1 Day Post-Op Procedure(s) (LRB): RIGHT TOTAL KNEE ARTHROPLASTY (Right) Patient reports pain as mild.   Patient seen in rounds with Dr. Wynelle Link.  Family at bedside. Patient is well, and has had no acute complaints or problems We will start therapy today.  Plan is to go Home after hospital stay.  Objective: Vital signs in last 24 hours: Temp:  [97.4 F (36.3 C)-98.3 F (36.8 C)] 98.2 F (36.8 C) (07/25 0550) Pulse Rate:  [49-79] 79 (07/25 0550) Resp:  [10-18] 15 (07/25 0550) BP: (115-135)/(70-92) 122/85 (07/25 0550) SpO2:  [91 %-100 %] 91 % (07/25 0550) Weight:  [95.3 kg (210 lb)] 95.3 kg (210 lb) (07/24 1445)  Intake/Output from previous day:  Intake/Output Summary (Last 24 hours) at 11/24/15 0810 Last data filed at 11/24/15 0631  Gross per 24 hour  Intake          4246.67 ml  Output             6144 ml  Net         -1897.33 ml    Intake/Output this shift: No intake/output data recorded.  Labs:  Recent Labs  11/24/15 0433  HGB 12.0*    Recent Labs  11/24/15 0433  WBC 13.8*  RBC 3.72*  HCT 33.1*  PLT 162    Recent Labs  11/24/15 0433  NA 138  K 4.0  CL 109  CO2 23  BUN 13  CREATININE 0.55*  GLUCOSE 172*  CALCIUM 8.7*   No results for input(s): LABPT, INR in the last 72 hours.  EXAM General - Patient is Alert, Appropriate and Oriented Extremity - Neurovascular intact Sensation intact distally Dorsiflexion/Plantar flexion intact Dressing - dressing C/D/I Motor Function - intact, moving foot and toes well on exam.  Hemovac pulled without difficulty.  Past Medical History:  Diagnosis Date  . Arthritis   . Cancer (HCC)    hx of skin cancer on nose   . Hypertension   . PONV (postoperative nausea and vomiting)     Assessment/Plan: 1 Day Post-Op Procedure(s) (LRB): RIGHT TOTAL KNEE ARTHROPLASTY (Right) Active Problems:   OA (osteoarthritis) of knee  Estimated body mass index is 31.01 kg/m as calculated from the  following:   Height as of this encounter: 5\' 9"  (1.753 m).   Weight as of this encounter: 95.3 kg (210 lb). Advance diet Up with therapy Plan for discharge tomorrow Discharge home with home health  DVT Prophylaxis - Xarelto Weight-Bearing as tolerated to right leg D/C O2 and Pulse OX and try on Room Air  Arlee Muslim, PA-C Orthopaedic Surgery 11/24/2015, 8:10 AM

## 2015-11-24 NOTE — Progress Notes (Signed)
Physical Therapy Treatment Patient Details Name: SHAMUS BOGGS MRN: [id number] DOB: [birth year] Today's Date: 11/24/2015    History of Present Illness 60 yo male s/p R TKA 11/23/15    PT Comments    Progressing well with mobility.   Follow Up Recommendations  Home health PT     Equipment Recommendations  None recommended by PT    Recommendations for Other Services       Precautions / Restrictions Precautions Precautions: Knee Required Braces or Orthoses: Knee Immobilizer - Right Knee Immobilizer - Right: Discontinue once straight leg raise with < 10 degree lag Restrictions Weight Bearing Restrictions: No RLE Weight Bearing: Weight bearing as tolerated    Mobility  Bed Mobility Overal bed mobility: Needs Assistance Bed Mobility: Sit to Supine       Sit to supine: Supervision   General bed mobility comments: NT -- up in recliner  Transfers Overall transfer level: Needs assistance Equipment used: Rolling walker (2 wheeled) Transfers: Sit to/from Stand Sit to Stand: Supervision         General transfer comment: supervision for safety.   Ambulation/Gait Ambulation/Gait assistance: Min guard Ambulation Distance (Feet): 105 Feet Assistive device: Rolling walker (2 wheeled) Gait Pattern/deviations: Step-to pattern;Step-through pattern;Decreased stride length;Antalgic     General Gait Details: close guard for safety.    Stairs            Wheelchair Mobility    Modified Rankin (Stroke Patients Only)       Balance                                    Cognition Arousal/Alertness: Awake/alert Behavior During Therapy: WFL for tasks assessed/performed Overall Cognitive Status: Within Functional Limits for tasks assessed                      Exercises      General Comments        Pertinent Vitals/Pain Pain Assessment: 0-10 Pain Score: 4  Pain Location: R knee Pain Descriptors / Indicators: Aching;Sore Pain  Intervention(s): Monitored during session;Ice applied;Repositioned    Home Living Family/patient expects to be discharged to:: Private residence Living Arrangements: Spouse/significant other Available Help at Discharge: Family Type of Home: House Home Access: Stairs to enter Entrance Stairs-Rails: Right Home Layout: One level Home Equipment: Environmental consultant - 2 wheels;Walker - 4 wheels;Cane - single point;Crutches;Bedside commode;Shower seat - built in;Grab bars - tub/shower      Prior Function Level of Independence: Independent          PT Goals (current goals can now be found in the care plan section) Acute Rehab PT Goals Patient Stated Goal: regain independence Progress towards PT goals: Progressing toward goals    Frequency  7X/week    PT Plan Current plan remains appropriate    Co-evaluation             End of Session   Activity Tolerance: Patient tolerated treatment well Patient left: in bed;with call bell/phone within reach;with family/visitor present     Time: [id number] PT Time Calculation (min) (ACUTE ONLY): 11 min  Charges:  $Gait Training: 8-22 mins                    G Codes:      Weston Anna, MPT Pager: [phone]

## 2015-11-24 NOTE — Evaluation (Signed)
Physical Therapy Evaluation Patient Details Name: LEAR MARTINETTI MRN: [id number] DOB: [birth year] Today's Date: 11/24/2015   History of Present Illness  60 yo male s/p R TKA 11/23/15  Clinical Impression  On eval, pt required Min assist for bed mobility and Min guard assist for ambulation. He walked ~75 feet with RW. Pain rated 6/10 with activity. Will follow and progress activity as tolerated.     Follow Up Recommendations Home health PT    Equipment Recommendations  None recommended by PT    Recommendations for Other Services OT consult     Precautions / Restrictions Precautions Precautions: Knee Required Braces or Orthoses: Knee Immobilizer - Right Knee Immobilizer - Right: Discontinue once straight leg raise with < 10 degree lag Restrictions Weight Bearing Restrictions: No RLE Weight Bearing: Weight bearing as tolerated      Mobility  Bed Mobility Overal bed mobility: Needs Assistance Bed Mobility: Supine to Sit     Supine to sit: Min assist     General bed mobility comments: assist for R LE.   Transfers Overall transfer level: Needs assistance Equipment used: Rolling walker (2 wheeled) Transfers: Sit to/from Stand           General transfer comment: close guard for safety. VCs safety, hand/LE placement.   Ambulation/Gait Ambulation/Gait assistance: Min guard Ambulation Distance (Feet): 75 Feet Assistive device: Rolling walker (2 wheeled) Gait Pattern/deviations: Step-to pattern;Antalgic     General Gait Details: close guard for safety. VCs safety, sequence.   Stairs            Wheelchair Mobility    Modified Rankin (Stroke Patients Only)       Balance                                             Pertinent Vitals/Pain Pain Assessment: 0-10 Pain Score: 6  Pain Location: R knee Pain Descriptors / Indicators: Aching;Sore Pain Intervention(s): Monitored during session;Ice applied;Repositioned    Home Living  Family/patient expects to be discharged to:: Private residence Living Arrangements: Spouse/significant other Available Help at Discharge: Family Type of Home: House Home Access: Stairs to enter Entrance Stairs-Rails: Right Entrance Stairs-Number of Steps: 2 Home Layout: One level Home Equipment: Environmental consultant - 2 wheels;Walker - 4 wheels;Crutches;Cane - single point      Prior Function Level of Independence: Independent               Hand Dominance        Extremity/Trunk Assessment   Upper Extremity Assessment: Defer to OT evaluation           Lower Extremity Assessment: RLE deficits/detail RLE Deficits / Details: hip flex 2/5, moves ankle well    Cervical / Trunk Assessment: Normal  Communication   Communication: No difficulties  Cognition Arousal/Alertness: Awake/alert Behavior During Therapy: WFL for tasks assessed/performed Overall Cognitive Status: Within Functional Limits for tasks assessed                      General Comments      Exercises Total Joint Exercises Ankle Circles/Pumps: AROM;Both;10 reps;Supine Quad Sets: AROM;Both;10 reps;Supine Heel Slides: AAROM;Right;10 reps;Supine Straight Leg Raises: AAROM;Right;10 reps;Supine      Assessment/Plan    PT Assessment Patient needs continued PT services  PT Diagnosis Acute pain;Difficulty walking   PT Problem List Decreased strength;Decreased range of motion;Decreased activity tolerance;Decreased balance;Decreased mobility;Pain;Decreased  knowledge of use of DME  PT Treatment Interventions DME instruction;Gait training;Stair training;Functional mobility training;Balance training;Therapeutic exercise;Therapeutic activities;Patient/family education   PT Goals (Current goals can be found in the Care Plan section) Acute Rehab PT Goals Patient Stated Goal: regain independence PT Goal Formulation: With patient/family Time For Goal Achievement: 12/08/15 Potential to Achieve Goals: Good     Frequency 7X/week   Barriers to discharge        Co-evaluation               End of Session Equipment Utilized During Treatment: Gait belt Activity Tolerance: Patient tolerated treatment well Patient left: in chair;with call bell/phone within reach;with family/visitor present           Time: 0920-0946 PT Time Calculation (min) (ACUTE ONLY): 26 min   Charges:   PT Evaluation $PT Eval Low Complexity: 1 Procedure PT Treatments $Gait Training: 8-22 mins   PT G Codes:        Weston Anna, MPT Pager: [phone]

## 2015-11-24 NOTE — Discharge Instructions (Addendum)
° °Dr. Frank Aluisio °Total Joint Specialist °[facility] °[address] °[place], [place] [address] °[phone] ° °TOTAL KNEE REPLACEMENT POSTOPERATIVE DIRECTIONS ° °Knee Rehabilitation, Guidelines Following Surgery  °Results after knee surgery are often greatly improved when you follow the exercise, range of motion and muscle strengthening exercises prescribed by your doctor. Safety measures are also important to protect the knee from further injury. Any time any of these exercises cause you to have increased pain or swelling in your knee joint, decrease the amount until you are comfortable again and slowly increase them. If you have problems or questions, call your caregiver or physical therapist for advice.  ° °HOME CARE INSTRUCTIONS  °Remove items at home which could result in a fall. This includes throw rugs or furniture in walking pathways.  °· ICE to the affected knee every three hours for 30 minutes at a time and then as needed for pain and swelling.  Continue to use ice on the knee for pain and swelling from surgery. You may notice swelling that will progress down to the foot and ankle.  This is normal after surgery.  Elevate the leg when you are not up walking on it.   °· Continue to use the breathing machine which will help keep your temperature down.  It is common for your temperature to cycle up and down following surgery, especially at night when you are not up moving around and exerting yourself.  The breathing machine keeps your lungs expanded and your temperature down. °· Do not place pillow under knee, focus on keeping the knee straight while resting ° °DIET °You may resume your previous home diet once your are discharged from the hospital. ° °DRESSING / WOUND CARE / SHOWERING °You may shower 3 days after surgery, but keep the wounds dry during showering.  You may use an occlusive plastic wrap (Press'n Seal for example), NO SOAKING/SUBMERGING IN THE BATHTUB.  If the  bandage gets wet, change with a clean dry gauze.  If the incision gets wet, pat the wound dry with a clean towel. °You may start showering once you are discharged home but do not submerge the incision under water. Just pat the incision dry and apply a dry gauze dressing on daily. °Change the surgical dressing daily and reapply a dry dressing each time. ° °ACTIVITY °Walk with your walker as instructed. °Use walker as long as suggested by your caregivers. °Avoid periods of inactivity such as sitting longer than an hour when not asleep. This helps prevent blood clots.  °You may resume a sexual relationship in one month or when given the OK by your doctor.  °You may return to work once you are cleared by your doctor.  °Do not drive a car for 6 weeks or until released by you surgeon.  °Do not drive while taking narcotics. ° °WEIGHT BEARING °Weight bearing as tolerated with assist device (walker, cane, etc) as directed, use it as long as suggested by your surgeon or therapist, typically at least 4-6 weeks. ° °POSTOPERATIVE CONSTIPATION PROTOCOL °Constipation - defined medically as fewer than three stools per week and severe constipation as less than one stool per week. ° °One of the most common issues patients have following surgery is constipation.  Even if you have a regular bowel pattern at home, your normal regimen is likely to be disrupted due to multiple reasons following surgery.  Combination of anesthesia, postoperative narcotics, change in appetite and fluid intake all can affect your bowels.    In order to avoid complications following surgery, here are some recommendations in order to help you during your recovery period. ° °Colace (docusate) - Pick up an over-the-counter form of Colace or another stool softener and take twice a day as long as you are requiring postoperative pain medications.  Take with a full glass of water daily.  If you experience loose stools or diarrhea, hold the colace until you stool forms  back up.  If your symptoms do not get better within 1 week or if they get worse, check with your doctor. ° °Dulcolax (bisacodyl) - Pick up over-the-counter and take as directed by the product packaging as needed to assist with the movement of your bowels.  Take with a full glass of water.  Use this product as needed if not relieved by Colace only.  ° °MiraLax (polyethylene glycol) - Pick up over-the-counter to have on hand.  MiraLax is a solution that will increase the amount of water in your bowels to assist with bowel movements.  Take as directed and can mix with a glass of water, juice, soda, coffee, or tea.  Take if you go more than two days without a movement. °Do not use MiraLax more than once per day. Call your doctor if you are still constipated or irregular after using this medication for 7 days in a row. ° °If you continue to have problems with postoperative constipation, please contact the office for further assistance and recommendations.  If you experience "the worst abdominal pain ever" or develop nausea or vomiting, please contact the office immediatly for further recommendations for treatment. ° °ITCHING ° If you experience itching with your medications, try taking only a single pain pill, or even half a pain pill at a time.  You can also use Benadryl over the counter for itching or also to help with sleep.  ° °TED HOSE STOCKINGS °Wear the elastic stockings on both legs for three weeks following surgery during the day but you may remove then at night for sleeping. ° °MEDICATIONS °See your medication summary on the “After Visit Summary” that the nursing staff will review with you prior to discharge.  You may have some home medications which will be placed on hold until you complete the course of blood thinner medication.  It is important for you to complete the blood thinner medication as prescribed by your surgeon.  Continue your approved medications as instructed at time of  discharge. ° °PRECAUTIONS °If you experience chest pain or shortness of breath - call 911 immediately for transfer to the hospital emergency department.  °If you develop a fever greater that 101 F, purulent drainage from wound, increased redness or drainage from wound, foul odor from the wound/dressing, or calf pain - CONTACT YOUR SURGEON.   °                                                °FOLLOW-UP APPOINTMENTS °Make sure you keep all of your appointments after your operation with your surgeon and caregivers. You should call the office at the above phone number and make an appointment for approximately two weeks after the date of your surgery or on the date instructed by your surgeon outlined in the "After Visit Summary". ° ° °RANGE OF MOTION AND STRENGTHENING EXERCISES  °Rehabilitation of the knee is important following a knee injury or   an operation. After just a few days of immobilization, the muscles of the thigh which control the knee become weakened and shrink (atrophy). Knee exercises are designed to build up the tone and strength of the thigh muscles and to improve knee motion. Often times heat used for twenty to thirty minutes before working out will loosen up your tissues and help with improving the range of motion but do not use heat for the first two weeks following surgery. These exercises can be done on a training (exercise) mat, on the floor, on a table or on a bed. Use what ever works the best and is most comfortable for you Knee exercises include:  °Leg Lifts - While your knee is still immobilized in a splint or cast, you can do straight leg raises. Lift the leg to 60 degrees, hold for 3 sec, and slowly lower the leg. Repeat 10-20 times 2-3 times daily. Perform this exercise against resistance later as your knee gets better.  °Quad and Hamstring Sets - Tighten up the muscle on the front of the thigh (Quad) and hold for 5-10 sec. Repeat this 10-20 times hourly. Hamstring sets are done by pushing the  foot backward against an object and holding for 5-10 sec. Repeat as with quad sets.  °· Leg Slides: Lying on your back, slowly slide your foot toward your buttocks, bending your knee up off the floor (only go as far as is comfortable). Then slowly slide your foot back down until your leg is flat on the floor again. °· Angel Wings: Lying on your back spread your legs to the side as far apart as you can without causing discomfort.  °A rehabilitation program following serious knee injuries can speed recovery and prevent re-injury in the future due to weakened muscles. Contact your doctor or a physical therapist for more information on knee rehabilitation.  ° °IF YOU ARE TRANSFERRED TO A SKILLED REHAB FACILITY °If the patient is transferred to a skilled rehab facility following release from the hospital, a list of the current medications will be sent to the facility for the patient to continue.  When discharged from the skilled rehab facility, please have the facility set up the patient's Home Health Physical Therapy prior to being released. Also, the skilled facility will be responsible for providing the patient with their medications at time of release from the facility to include their pain medication, the muscle relaxants, and their blood thinner medication. If the patient is still at the rehab facility at time of the two week follow up appointment, the skilled rehab facility will also need to assist the patient in arranging follow up appointment in our office and any transportation needs. ° °MAKE SURE YOU:  °Understand these instructions.  °Get help right away if you are not doing well or get worse.  ° ° °Pick up stool softner and laxative for home use following surgery while on pain medications. °Do not submerge incision under water. °Please use good hand washing techniques while changing dressing each day. °May shower starting three days after surgery. °Please use a clean towel to pat the incision dry following  showers. °Continue to use ice for pain and swelling after surgery. °Do not use any lotions or creams on the incision until instructed by your surgeon. ° °Take Xarelto for two and a half more weeks, then discontinue Xarelto. °Once the patient has completed the Xarelto, they may resume the 81 mg Aspirin. ° ° °Information on my medicine - XARELTO® (Rivaroxaban) ° °  This medication education was reviewed with me or my healthcare representative as part of my discharge preparation.   ° °Why was Xarelto® prescribed for you? °Xarelto® was prescribed for you to reduce the risk of blood clots forming after orthopedic surgery. The medical term for these abnormal blood clots is venous thromboembolism (VTE). ° °What do you need to know about xarelto® ? °Take your Xarelto® ONCE DAILY at the same time every day. °You may take it either with or without food. ° °If you have difficulty swallowing the tablet whole, you may crush it and mix in applesauce just prior to taking your dose. ° °Take Xarelto® exactly as prescribed by your doctor and DO NOT stop taking Xarelto® without talking to the doctor who prescribed the medication.  Stopping without other VTE prevention medication to take the place of Xarelto® may increase your risk of developing a clot. ° °After discharge, you should have regular check-up appointments with your healthcare provider that is prescribing your Xarelto®.   ° °What do you do if you miss a dose? °If you miss a dose, take it as soon as you remember on the same day then continue your regularly scheduled once daily regimen the next day. Do not take two doses of Xarelto® on the same day.  ° °Important Safety Information °A possible side effect of Xarelto® is bleeding. You should call your healthcare provider right away if you experience any of the following: °? Bleeding from an injury or your nose that does not stop. °? Unusual colored urine (red or dark brown) or unusual colored stools (red or black). °? Unusual  bruising for unknown reasons. °? A serious fall or if you hit your head (even if there is no bleeding). ° °Some medicines may interact with Xarelto® and might increase your risk of bleeding while on Xarelto®. To help avoid this, consult your healthcare provider or pharmacist prior to using any new prescription or non-prescription medications, including herbals, vitamins, non-steroidal anti-inflammatory drugs (NSAIDs) and supplements. ° °This website has more information on Xarelto®: www.xarelto.com. ° ° °

## 2015-11-24 NOTE — Care Management Note (Signed)
Case Management Note  Patient Details  Name: Derek Olson MRN: [id number] Date of Birth: 10-12-1955  Subjective/Objective:                  RIGHT TOTAL KNEE ARTHROPLASTY (Right) Action/Plan: Discharge planning Expected Discharge Date:                  Expected Discharge Plan:  [place]  In-House Referral:     Discharge planning Services  CM Consult  Post Acute Care Choice:  Home Health Choice offered to:  Patient  DME Arranged:  N/A DME Agency:  NA  HH Arranged:  PT [place] Agency:  Other - See comment  Status of Service:  Completed, signed off  If discussed at [place] of Stay Meetings, dates discussed:    Additional Comments: CM met with pt to explain [employer] not widely accepted and requires submission to [facility] ([place]) for authorization and [facility] will arrange an agency to render HHPT.  Pt verbalized understanding.  CM called [facility] [phone] with referral and spoke with Maudie Mercury who requests I fax facesheet, orders, F2F, H&P, OP note, and PT Eval note to [phone].  CM faxed requested information and received confirmation of receipt.  Pt has all DME at home and denies additional DME needs. CM has placed [facility] contact information on pt AVS.  NO other CM needs were communicated. Dellie Catholic, RN 11/24/2015, 3:24 PM

## 2015-11-25 LAB — BASIC METABOLIC PANEL
Anion gap: 6 (ref 5–15)
BUN: 16 mg/dL (ref 6–20)
CALCIUM: 8.6 mg/dL — AB (ref 8.9–10.3)
CHLORIDE: 107 mmol/L (ref 101–111)
CO2: 26 mmol/L (ref 22–32)
CREATININE: 0.6 mg/dL — AB (ref 0.61–1.24)
GFR calc non Af Amer: >60 mL/min (ref >60)
GLUCOSE: 131 mg/dL — AB (ref 65–99)
Potassium: 3.8 mmol/L (ref 3.5–5.1)
Sodium: 139 mmol/L (ref 135–145)

## 2015-11-25 LAB — CBC
HEMATOCRIT: 33.7 % — AB (ref 39.0–52.0)
HEMOGLOBIN: 12 g/dL — AB (ref 13.0–17.0)
MCH: 32 pg (ref 26.0–34.0)
MCHC: 35.6 g/dL (ref 30.0–36.0)
MCV: 89.9 fL (ref 78.0–100.0)
Platelets: 185 10*3/uL (ref 150–400)
RBC: 3.75 MIL/uL — ABNORMAL LOW (ref 4.22–5.81)
RDW: 13.4 % (ref 11.5–15.5)
WBC: 19.8 10*3/uL — ABNORMAL HIGH (ref 4.0–10.5)

## 2015-11-25 NOTE — Progress Notes (Addendum)
Physical Therapy Treatment Patient Details Name: Derek Olson MRN: [id number] DOB: 03-17-1956 Today's Date: 11/25/2015    History of Present Illness 60 yo male s/p R TKA 11/23/15    PT Comments    Progressing with mobility. Reviewed/practiced exercises, gait training, and stair training. Issued HEP handout and instructed pt to perform exercises 2x/day until OP PT begins.  All education completed. Ready to d/c from PT standpoint.   Follow Up Recommendations  Outpatient PT     Equipment Recommendations  None recommended by PT    Recommendations for Other Services       Precautions / Restrictions Precautions Precautions: Knee Required Braces or Orthoses: Knee Immobilizer - Right Knee Immobilizer - Right: Discontinue once straight leg raise with < 10 degree lag Restrictions Weight Bearing Restrictions: No RLE Weight Bearing: Weight bearing as tolerated    Mobility  Bed Mobility Overal bed mobility: Needs Assistance Bed Mobility: Supine to Sit     Supine to sit: Min assist     General bed mobility comments: Assist for R LE.  Transfers Overall transfer level: Needs assistance Equipment used: Rolling walker (2 wheeled) Transfers: Sit to/from Stand Sit to Stand: Supervision         General transfer comment: supervision for safety.   Ambulation/Gait Ambulation/Gait assistance: Min guard Ambulation Distance (Feet): 100 Feet Assistive device: Rolling walker (2 wheeled) Gait Pattern/deviations: Step-to pattern;Antalgic     General Gait Details: close guard for safety.    Stairs Stairs: Yes Stairs assistance: Min guard Stair Management: One rail Left;Step to pattern;Forwards Number of Stairs: 2 General stair comments: VCs safety, technique, sequence. Pt used 2 hands on 1 rail. close guard for safety. Wife present to observe.   Wheelchair Mobility    Modified Rankin (Stroke Patients Only)       Balance                                     Cognition Arousal/Alertness: Awake/alert Behavior During Therapy: WFL for tasks assessed/performed Overall Cognitive Status: Within Functional Limits for tasks assessed                      Exercises Total Joint Exercises Ankle Circles/Pumps: AROM;Both;10 reps;Supine Quad Sets: AROM;Both;10 reps;Supine Heel Slides: AAROM;Right;10 reps;Supine (pt used sheet to assist for 5 reps) Hip ABduction/ADduction: AAROM;Right;10 reps;Supine Straight Leg Raises: AAROM;Right;10 reps;Supine    General Comments        Pertinent Vitals/Pain Pain Assessment: 0-10 Pain Score: 5  Pain Location: R knee Pain Descriptors / Indicators: Aching;Sore Pain Intervention(s): Monitored during session;Ice applied;Repositioned    Home Living                      Prior Function            PT Goals (current goals can now be found in the care plan section) Progress towards PT goals: Progressing toward goals    Frequency  7X/week    PT Plan Current plan remains appropriate    Co-evaluation             End of Session Equipment Utilized During Treatment: Gait belt;Right knee immobilizer Activity Tolerance: Patient tolerated treatment well Patient left: in chair;with call bell/phone within reach;with family/visitor present     Time: [id number] PT Time Calculation (min) (ACUTE ONLY): 23 min  Charges:  $Gait Training: 8-22 mins $Therapeutic Exercise: 8-22 mins  G Codes:      Weston Anna, MPT Pager: [phone]

## 2015-11-25 NOTE — Progress Notes (Signed)
CM has not received callback from [facility].  CM discussed with PA possibility of pt going directly to outpt PT at [facility] and PA consents.  Cm spoke with pt concerning [employer] and pt prefers to go to outpt PT.  Cm called [facility] office and pt has appt for PT July 28 at 11:15 and surgical follow up August 9, at 3:15.  Cm has placed information on pt's AVS and has given pt verbal notification of both appointments.

## 2015-11-25 NOTE — Progress Notes (Signed)
   Subjective: 2 Days Post-Op Procedure(s) (LRB): RIGHT TOTAL KNEE ARTHROPLASTY (Right) Patient reports pain as mild.   Patient seen in rounds with Dr. Wynelle Link. Patient is well, but has had some minor complaints of pain in the knee, requiring pain medications Patient is ready to go home  Objective: Vital signs in last 24 hours: Temp:  [97.3 F (36.3 C)-98.6 F (37 C)] 98.6 F (37 C) (07/26 0518) Pulse Rate:  [73-82] 73 (07/26 0740) Resp:  [15-16] 15 (07/26 0518) BP: (124-144)/(72-83) 144/81 (07/26 0740) SpO2:  [97 %-98 %] 98 % (07/26 0518)  Intake/Output from previous day:  Intake/Output Summary (Last 24 hours) at 11/25/15 1055 Last data filed at 11/25/15 0849  Gross per 24 hour  Intake              480 ml  Output                0 ml  Net              480 ml    Intake/Output this shift: Total I/O In: 120 [P.O.:120] Out: -   Labs:  Recent Labs  11/24/15 0433 11/25/15 0420  HGB 12.0* 12.0*    Recent Labs  11/24/15 0433 11/25/15 0420  WBC 13.8* 19.8*  RBC 3.72* 3.75*  HCT 33.1* 33.7*  PLT 162 185    Recent Labs  11/24/15 0433 11/25/15 0420  NA 138 139  K 4.0 3.8  CL 109 107  CO2 23 26  BUN 13 16  CREATININE 0.55* 0.60*  GLUCOSE 172* 131*  CALCIUM 8.7* 8.6*   No results for input(s): LABPT, INR in the last 72 hours.  EXAM: General - Patient is Alert, Appropriate and Oriented Extremity - Neurovascular intact Sensation intact distally Dorsiflexion/Plantar flexion intact Incision - clean, no drainage Motor Function - intact, moving foot and toes well on exam.   Assessment/Plan: 2 Days Post-Op Procedure(s) (LRB): RIGHT TOTAL KNEE ARTHROPLASTY (Right) Procedure(s) (LRB): RIGHT TOTAL KNEE ARTHROPLASTY (Right) Past Medical History:  Diagnosis Date  . Arthritis   . Cancer (HCC)    hx of skin cancer on nose   . Hypertension   . PONV (postoperative nausea and vomiting)    Active Problems:   OA (osteoarthritis) of knee  Estimated body mass  index is 31.01 kg/m as calculated from the following:   Height as of this encounter: 5\' 9"  (1.753 m).   Weight as of this encounter: 95.3 kg (210 lb). Up with therapy Diet - Cardiac diet Follow up - in 2 weeks Activity - WBAT Disposition - Home Condition Upon Discharge - Good D/C Meds - See DC Summary DVT Prophylaxis - [place], PA-C Orthopaedic Surgery 11/25/2015, 10:55 AM  \

## 2019-03-21 ENCOUNTER — Ambulatory Visit (INDEPENDENT_AMBULATORY_CARE_PROVIDER_SITE_OTHER): Payer: 59

## 2019-03-21 ENCOUNTER — Other Ambulatory Visit: Payer: Self-pay

## 2019-03-21 DIAGNOSIS — W19XXXA Unspecified fall, initial encounter: Secondary | ICD-10-CM

## 2019-03-21 DIAGNOSIS — M25561 Pain in right knee: Secondary | ICD-10-CM | POA: Diagnosis not present

## 2019-04-11 ENCOUNTER — Other Ambulatory Visit: Payer: Self-pay

## 2019-04-11 DIAGNOSIS — Z20822 Contact with and (suspected) exposure to covid-19: Secondary | ICD-10-CM

## 2019-04-12 ENCOUNTER — Telehealth: Payer: Self-pay | Admitting: *Deleted

## 2019-04-12 NOTE — Telephone Encounter (Signed)
Wife and pt called to check covid results. They are advise that the results are not back yet. Verbal understanding.

## 2019-04-12 NOTE — Telephone Encounter (Signed)
Pt calling for covid results; active, not resulted yet. Verbalizes understanding.

## 2019-04-13 ENCOUNTER — Telehealth: Payer: Self-pay | Admitting: General Practice

## 2019-04-13 LAB — NOVEL CORONAVIRUS, NAA: SARS-CoV-2, NAA: NOT DETECTED

## 2019-04-13 NOTE — Telephone Encounter (Signed)
Negative COVID results given. Patient results "NOT Detected." Caller expressed understanding. ° °

## 2019-07-06 ENCOUNTER — Ambulatory Visit: Payer: Managed Care, Other (non HMO) | Attending: Internal Medicine

## 2019-07-06 DIAGNOSIS — Z23 Encounter for immunization: Secondary | ICD-10-CM | POA: Insufficient documentation

## 2019-07-06 NOTE — Progress Notes (Signed)
   Covid-19 Vaccination Clinic  Name:  Derek Olson    MRN: [id number] DOB: Jan 06, 1956  07/06/2019  Derek Olson was observed post Covid-19 immunization for 15 minutes without incident. He was provided with Vaccine Information Sheet and instruction to access the V-Safe system.   Derek Olson was instructed to call 911 with any severe reactions post vaccine: Marland Kitchen Difficulty breathing  . Swelling of face and throat  . A fast heartbeat  . A bad rash all over body  . Dizziness and weakness   Immunizations Administered    Name Date Dose VIS Date Route   Moderna COVID-19 Vaccine 07/06/2019 10:42 AM 0.5 mL 04/02/2019 Intramuscular   Manufacturer: Moderna   Lot: [id number]   [id number]

## 2019-08-07 ENCOUNTER — Ambulatory Visit: Payer: Managed Care, Other (non HMO) | Attending: Internal Medicine

## 2019-08-07 DIAGNOSIS — Z23 Encounter for immunization: Secondary | ICD-10-CM

## 2019-08-07 NOTE — Progress Notes (Signed)
   Covid-19 Vaccination Clinic  Name:  Derek Olson    MRN: [id number] DOB: 31-Mar-1956  08/07/2019  [name] was observed post Covid-19 immunization for 15 minutes without incident. He was provided with Vaccine Information Sheet and instruction to access the V-Safe system.   [name] was instructed to call 911 with any severe reactions post vaccine: Marland Kitchen Difficulty breathing  . Swelling of face and throat  . A fast heartbeat  . A bad rash all over body  . Dizziness and weakness   Immunizations Administered    Name Date Dose VIS Date Route   Moderna COVID-19 Vaccine 08/07/2019 11:24 AM 0.5 mL 04/02/2019 Intramuscular   Manufacturer: Levan Hurst   LotFY:[id number]   [place][id number]

## 2019-08-08 ENCOUNTER — Ambulatory Visit: Payer: Managed Care, Other (non HMO)

## 2019-08-12 ENCOUNTER — Encounter (INDEPENDENT_AMBULATORY_CARE_PROVIDER_SITE_OTHER): Payer: Self-pay | Admitting: *Deleted

## 2019-11-08 ENCOUNTER — Other Ambulatory Visit (INDEPENDENT_AMBULATORY_CARE_PROVIDER_SITE_OTHER): Payer: Self-pay | Admitting: *Deleted

## 2019-11-12 ENCOUNTER — Other Ambulatory Visit (INDEPENDENT_AMBULATORY_CARE_PROVIDER_SITE_OTHER): Payer: Self-pay | Admitting: *Deleted

## 2019-11-12 DIAGNOSIS — Z1211 Encounter for screening for malignant neoplasm of colon: Secondary | ICD-10-CM

## 2019-12-03 ENCOUNTER — Telehealth (INDEPENDENT_AMBULATORY_CARE_PROVIDER_SITE_OTHER): Payer: Self-pay | Admitting: *Deleted

## 2019-12-03 ENCOUNTER — Encounter (INDEPENDENT_AMBULATORY_CARE_PROVIDER_SITE_OTHER): Payer: Self-pay | Admitting: *Deleted

## 2019-12-03 NOTE — Telephone Encounter (Signed)
Patient needs Sutab (copay card) ° °

## 2019-12-04 MED ORDER — SUTAB 1479-225-188 MG PO TABS: 1.0000 | ORAL_TABLET | Freq: Once | ORAL | 0 refills | Status: AC

## 2019-12-09 ENCOUNTER — Telehealth (INDEPENDENT_AMBULATORY_CARE_PROVIDER_SITE_OTHER): Payer: Self-pay | Admitting: *Deleted

## 2019-12-09 NOTE — Telephone Encounter (Signed)
Referring MD/PCP: fagan   Procedure: tcs  Reason/Indication:  screening  Has patient had this procedure before?  Yes, 2011  If so, when, by whom and where?    Is there a family history of colon cancer?  no  Who?  What age when diagnosed?    Is patient diabetic?   no      Does patient have prosthetic heart valve or mechanical valve?  no  Do you have a pacemaker/defibrillator?  no  Has patient ever had endocarditis/atrial fibrillation? no  Does patient use oxygen? no  Has patient had joint replacement within last 12 months?  no  Is patient constipated or do they take laxatives? no  Does patient have a history of alcohol/drug use?  no  Is patient on blood thinner such as Coumadin, Plavix and/or Aspirin? yes  Medications: asa 81 mg daily, olmesartan 20 mg daily, mvi daily, gflax seed oil 1200 mg bid, magnesium 400 mg bid, krill oil daily, turmeric bid, cinnamon 1000 mg bid, sunflwoer 0964 mg bid, folic acid 383 mg daily, zinc 50 mg daily, alpha lipoic 200 mg daily, coq 10 daily, coconut oil 1000 mg bid, hmb & d3 bid, multi b daily, selenium 100 mcg daily, black elderberry 1000 mg daily, milk thistle daily, vit a daily, echinacea daily, vit d3 daily, vit c daily, hyaluronic daily, vit e daily, loratadine 10 mg daily, reboost daily  Allergies: hydrocodone, dexamethizone  Medication Adjustment per Dr Rehman/Dr Jenetta Downer asa 2 days,vit e 10 days  Procedure date & time: 01/09/20

## 2020-01-07 ENCOUNTER — Other Ambulatory Visit (HOSPITAL_COMMUNITY): Payer: Managed Care, Other (non HMO)

## 2020-01-08 ENCOUNTER — Other Ambulatory Visit: Payer: Self-pay

## 2020-01-08 ENCOUNTER — Other Ambulatory Visit (HOSPITAL_COMMUNITY)
Admission: RE | Admit: 2020-01-08 | Discharge: 2020-01-08 | Disposition: A | Discharge status: Home / Self Care | Payer: Managed Care, Other (non HMO) | Source: Ambulatory Visit | Attending: Internal Medicine | Admitting: Internal Medicine

## 2020-01-08 DIAGNOSIS — Z01812 Encounter for preprocedural laboratory examination: Secondary | ICD-10-CM | POA: Diagnosis not present

## 2020-01-08 DIAGNOSIS — Z20822 Contact with and (suspected) exposure to covid-19: Secondary | ICD-10-CM | POA: Diagnosis not present

## 2020-01-08 LAB — SARS CORONAVIRUS 2 (TAT 6-24 HRS): SARS Coronavirus 2: NEGATIVE

## 2020-01-09 ENCOUNTER — Encounter (HOSPITAL_COMMUNITY): Payer: Self-pay | Admitting: Internal Medicine

## 2020-01-09 ENCOUNTER — Ambulatory Visit (HOSPITAL_COMMUNITY)
Admission: RE | Admit: 2020-01-09 | Discharge: 2020-01-09 | Disposition: A | Discharge status: Home / Self Care | Payer: Managed Care, Other (non HMO) | Attending: Internal Medicine | Admitting: Internal Medicine

## 2020-01-09 ENCOUNTER — Encounter (HOSPITAL_COMMUNITY)
Admission: RE | Disposition: A | Discharge status: Home / Self Care | Payer: Self-pay | Source: Home / Self Care | Attending: Internal Medicine

## 2020-01-09 ENCOUNTER — Other Ambulatory Visit: Payer: Self-pay

## 2020-01-09 DIAGNOSIS — I10 Essential (primary) hypertension: Secondary | ICD-10-CM | POA: Diagnosis not present

## 2020-01-09 DIAGNOSIS — Z85828 Personal history of other malignant neoplasm of skin: Secondary | ICD-10-CM | POA: Insufficient documentation

## 2020-01-09 DIAGNOSIS — Z79899 Other long term (current) drug therapy: Secondary | ICD-10-CM | POA: Insufficient documentation

## 2020-01-09 DIAGNOSIS — D123 Benign neoplasm of transverse colon: Secondary | ICD-10-CM

## 2020-01-09 DIAGNOSIS — Z885 Allergy status to narcotic agent status: Secondary | ICD-10-CM | POA: Diagnosis not present

## 2020-01-09 DIAGNOSIS — Z7982 Long term (current) use of aspirin: Secondary | ICD-10-CM | POA: Diagnosis not present

## 2020-01-09 DIAGNOSIS — Z96651 Presence of right artificial knee joint: Secondary | ICD-10-CM | POA: Diagnosis not present

## 2020-01-09 DIAGNOSIS — D122 Benign neoplasm of ascending colon: Secondary | ICD-10-CM

## 2020-01-09 DIAGNOSIS — K644 Residual hemorrhoidal skin tags: Secondary | ICD-10-CM | POA: Insufficient documentation

## 2020-01-09 DIAGNOSIS — Z1211 Encounter for screening for malignant neoplasm of colon: Secondary | ICD-10-CM | POA: Insufficient documentation

## 2020-01-09 DIAGNOSIS — Z888 Allergy status to other drugs, medicaments and biological substances status: Secondary | ICD-10-CM | POA: Insufficient documentation

## 2020-01-09 DIAGNOSIS — M1990 Unspecified osteoarthritis, unspecified site: Secondary | ICD-10-CM | POA: Insufficient documentation

## 2020-01-09 HISTORY — PX: POLYPECTOMY: SHX5525

## 2020-01-09 HISTORY — PX: COLONOSCOPY: SHX5424

## 2020-01-09 LAB — HM COLONOSCOPY

## 2020-01-09 SURGERY — COLONOSCOPY
Anesthesia: Moderate Sedation

## 2020-01-09 MED ORDER — MIDAZOLAM HCL 5 MG/5ML IJ SOLN
INTRAMUSCULAR | Status: AC
  Filled 2020-01-09: qty 10

## 2020-01-09 MED ORDER — MIDAZOLAM HCL 5 MG/5ML IJ SOLN
INTRAMUSCULAR | Status: DC | PRN
  Administered 2020-01-09 (×2): 2 mg via INTRAVENOUS

## 2020-01-09 MED ORDER — STERILE WATER FOR IRRIGATION IR SOLN
Status: DC | PRN
  Administered 2020-01-09: 1.5 mL

## 2020-01-09 MED ORDER — MEPERIDINE HCL 50 MG/ML IJ SOLN
INTRAMUSCULAR | Status: AC
  Filled 2020-01-09: qty 1

## 2020-01-09 MED ORDER — SODIUM CHLORIDE 0.9 % IV SOLN: INTRAVENOUS | Status: DC

## 2020-01-09 MED ORDER — MEPERIDINE HCL 50 MG/ML IJ SOLN
INTRAMUSCULAR | Status: DC | PRN
Start: 2020-01-09 — End: 2020-01-09
  Administered 2020-01-09 (×2): 25 mg

## 2020-01-09 NOTE — Discharge Instructions (Signed)
Resume aspirin on 01/10/2020 Resume other medications and diet as before. No driving for 24 hours. Physician will call with biopsy results.       Colonoscopy, Adult, Care After This sheet gives you information about how to care for yourself after your procedure. Your doctor may also give you more specific instructions. If you have problems or questions, call your doctor. What can I expect after the procedure? After the procedure, it is common to have:  A small amount of blood in your poop (stool) for 24 hours.  Some gas.  Mild cramping or bloating in your belly (abdomen). Follow these instructions at home: Eating and drinking   Drink enough fluid to keep your pee (urine) pale yellow.  Follow instructions from your doctor about what you cannot eat or drink.  Return to your normal diet as told by your doctor. Avoid heavy or fried foods that are hard to digest. Activity  Rest as told by your doctor.  Do not sit for a long time without moving. Get up to take short walks every 1-2 hours. This is important. Ask for help if you feel weak or unsteady.  Return to your normal activities as told by your doctor. Ask your doctor what activities are safe for you. To help cramping and bloating:   Try walking around.  Put heat on your belly as told by your doctor. Use the heat source that your doctor recommends, such as a moist heat pack or a heating pad. ? Put a towel between your skin and the heat source. ? Leave the heat on for 20-30 minutes. ? Remove the heat if your skin turns bright red. This is very important if you are unable to feel pain, heat, or cold. You may have a greater risk of getting burned. General instructions  For the first 24 hours after the procedure: ? Do not drive or use machinery. ? Do not sign important documents. ? Do not drink alcohol. ? Do your daily activities more slowly than normal. ? Eat foods that are soft and easy to digest.  Take  over-the-counter or prescription medicines only as told by your doctor.  Keep all follow-up visits as told by your doctor. This is important. Contact a doctor if:  You have blood in your poop 2-3 days after the procedure. Get help right away if:  You have more than a small amount of blood in your poop.  You see large clumps of tissue (blood clots) in your poop.  Your belly is swollen.  You feel like you may vomit (nauseous).  You vomit.  You have a fever.  You have belly pain that gets worse, and medicine does not help your pain. Summary  After the procedure, it is common to have a small amount of blood in your poop. You may also have mild cramping and bloating in your belly.  For the first 24 hours after the procedure, do not drive or use machinery, do not sign important documents, and do not drink alcohol.  Get help right away if you have a lot of blood in your poop, feel like you may vomit, have a fever, or have more belly pain. This information is not intended to replace advice given to you by your health care provider. Make sure you discuss any questions you have with your health care provider. Document Revised: 11/12/2018 Document Reviewed: 11/12/2018 Elsevier Patient Education  [place].     Colon Polyps  Polyps are tissue growths inside  the body. Polyps can grow in many places, including the large intestine (colon). A polyp may be a round bump or a mushroom-shaped growth. You could have one polyp or several. Most colon polyps are noncancerous (benign). However, some colon polyps can become cancerous over time. Finding and removing the polyps early can help prevent this. What are the causes? The exact cause of colon polyps is not known. What increases the risk? You are more likely to develop this condition if you:  Have a family history of colon cancer or colon polyps.  Are older than 38 or older than 45 if you are African American.  Have inflammatory  bowel disease, such as ulcerative colitis or Crohn's disease.  Have certain hereditary conditions, such as: ? Familial adenomatous polyposis. ? Lynch syndrome. ? Turcot syndrome. ? Peutz-Jeghers syndrome.  Are overweight.  Smoke cigarettes.  Do not get enough exercise.  Drink too much alcohol.  Eat a diet that is high in fat and red meat and low in fiber.  Had childhood cancer that was treated with abdominal radiation. What are the signs or symptoms? Most polyps do not cause symptoms. If you have symptoms, they may include:  Blood coming from your rectum when having a bowel movement.  Blood in your stool. The stool may look dark red or black.  Abdominal pain.  A change in bowel habits, such as constipation or diarrhea. How is this diagnosed? This condition is diagnosed with a colonoscopy. This is a procedure in which a lighted, flexible scope is inserted into the anus and then passed into the colon to examine the area. Polyps are sometimes found when a colonoscopy is done as part of routine cancer screening tests. How is this treated? Treatment for this condition involves removing any polyps that are found. Most polyps can be removed during a colonoscopy. Those polyps will then be tested for cancer. Additional treatment may be needed depending on the results of testing. Follow these instructions at home: Lifestyle  Maintain a healthy weight, or lose weight if recommended by your health care provider.  Exercise every day or as told by your health care provider.  Do not use any products that contain nicotine or tobacco, such as cigarettes and e-cigarettes. If you need help quitting, ask your health care provider.  If you drink alcohol, limit how much you have: ? 0-1 drink a day for women. ? 0-2 drinks a day for men.  Be aware of how much alcohol is in your drink. In the [place] one drink equals one 12 oz bottle of beer (355 mL), one 5 oz glass of wine (148 mL), or one 1 oz  shot of hard liquor (44 mL). Eating and drinking   Eat foods that are high in fiber, such as fruits, vegetables, and whole grains.  Eat foods that are high in calcium and vitamin D, such as milk, cheese, yogurt, eggs, liver, fish, and broccoli.  Limit foods that are high in fat, such as fried foods and desserts.  Limit the amount of red meat and processed meat you eat, such as hot dogs, sausage, bacon, and lunch meats. General instructions  Keep all follow-up visits as told by your health care provider. This is important. ? This includes having regularly scheduled colonoscopies. ? Talk to your health care provider about when you need a colonoscopy. Contact a health care provider if:  You have new or worsening bleeding during a bowel movement.  You have new or increased blood in your  stool.  You have a change in bowel habits.  You lose weight for no known reason. Summary  Polyps are tissue growths inside the body. Polyps can grow in many places, including the colon.  Most colon polyps are noncancerous (benign), but some can become cancerous over time.  This condition is diagnosed with a colonoscopy.  Treatment for this condition involves removing any polyps that are found. Most polyps can be removed during a colonoscopy. This information is not intended to replace advice given to you by your health care provider. Make sure you discuss any questions you have with your health care provider. Document Revised: 08/03/2017 Document Reviewed: 08/03/2017 Elsevier Patient Education  [place].

## 2020-01-09 NOTE — Op Note (Signed)
[facility] Patient Name: Derek Olson Procedure Date: 01/09/2020 9:10 AM MRN: [id number] Date of Birth: [birth year] Attending MD: Hildred Laser , MD CSN: [id number] Age: 64 Admit Type: Outpatient Procedure:                Colonoscopy Indications:              Screening for colorectal malignant neoplasm Providers:                Hildred Laser, MD, Charlsie Quest. Theda Sers RN, RN,                            [employer], Casimer Bilis,                            Technician, Aram Candela Referring MD:             Asencion Noble, MD Medicines:                Meperidine 50 mg IV, Midazolam 4 mg IV Complications:            No immediate complications. Estimated Blood Loss:     Estimated blood loss was minimal. Procedure:                Pre-Anesthesia Assessment:                           - Prior to the procedure, a History and Physical                            was performed, and patient medications and                            allergies were reviewed. The patient's tolerance of                            previous anesthesia was also reviewed. The risks                            and benefits of the procedure and the sedation                            options and risks were discussed with the patient.                            All questions were answered, and informed consent                            was obtained. Prior Anticoagulants: The patient has                            taken no previous anticoagulant or antiplatelet                            agents except for aspirin. ASA Grade Assessment: II                            -  A patient with mild systemic disease. After                            reviewing the risks and benefits, the patient was                            deemed in satisfactory condition to undergo the                            procedure.                           After obtaining informed consent, the colonoscope                            was passed under  direct vision. Throughout the                            procedure, the patient's blood pressure, pulse, and                            oxygen saturations were monitored continuously. The                            [id number]-[id number] ([id number]) scope was introduced through                            the anus and advanced to the the cecum, identified                            by appendiceal orifice and ileocecal valve. The                            colonoscopy was performed without difficulty. The                            patient tolerated the procedure well. The quality                            of the bowel preparation was good. The ileocecal                            valve, appendiceal orifice, and rectum were                            photographed. Scope In: 9:26:47 AM Scope Out: 9:50:53 AM Scope Withdrawal Time: 0 hours 18 minutes 26 seconds  Total Procedure Duration: 0 hours 24 minutes 6 seconds  Findings:      The perianal and digital rectal examinations were normal.      A 5 mm polyp was found in the mid ascending colon. The polyp was removed       with a cold snare. Resection and retrieval were complete. The pathology       specimen was placed into Bottle Number 1.  A small polyp was found in the hepatic flexure. Biopsies were taken with       a cold forceps for histology. The pathology specimen was placed into       Bottle Number 1.      The exam was otherwise normal throughout the examined colon.      External hemorrhoids were found during retroflexion. The hemorrhoids       were small. Impression:               - One 5 mm polyp in the mid ascending colon,                            removed with a cold snare. Resected and retrieved.                           - One small polyp at the hepatic flexure. Biopsied.                           - External hemorrhoids. Moderate Sedation:      Moderate (conscious) sedation was administered by the endoscopy nurse       and supervised  by the endoscopist. The following parameters were       monitored: oxygen saturation, heart rate, blood pressure, CO2       capnography and response to care. Total physician intraservice time was       29 minutes. Recommendation:           - Patient has a contact number available for                            emergencies. The signs and symptoms of potential                            delayed complications were discussed with the                            patient. Return to normal activities tomorrow.                            Written discharge instructions were provided to the                            patient.                           - Resume previous diet today.                           - Continue present medications.                           - No aspirin, ibuprofen, naproxen, or other                            non-steroidal anti-inflammatory drugs for 1 day.                           -  Await pathology results.                           - Repeat colonoscopy is recommended. The                            colonoscopy date will be determined after pathology                            results from today's exam become available for                            review. Procedure Code(s):        --- Professional ---                           [phone], Colonoscopy, flexible; with removal of                            tumor(s), polyp(s), or other lesion(s) by snare                            technique                           45380, 59, Colonoscopy, flexible; with biopsy,                            single or multiple                           99153, Moderate sedation; each additional 15                            minutes intraservice time                           G0500, Moderate sedation services provided by the                            same physician or other qualified health care                            professional performing a gastrointestinal                             endoscopic service that sedation supports,                            requiring the presence of an independent trained                            observer to assist in the monitoring of the                            patient's level of consciousness and physiological  status; initial 15 minutes of intra-service time;                            patient age 21 years or older (additional time may                            be reported with [phone], as appropriate) Diagnosis Code(s):        --- Professional ---                           Z12.11, Encounter for screening for malignant                            neoplasm of colon                           K63.5, Polyp of colon                           K64.4, Residual hemorrhoidal skin tags CPT copyright 2019 American Medical Association. All rights reserved. The codes documented in this report are preliminary and upon coder review may  be revised to meet current compliance requirements. Hildred Laser, MD Hildred Laser, MD 01/09/2020 9:58:55 AM This report has been signed electronically. Number of Addenda: 0

## 2020-01-09 NOTE — H&P (Signed)
Derek Olson is an 64 y.o. male.   Chief Complaint: Patient is here for colonoscopy. HPI: Patient is 64 year old Caucasian male who is here for screening colonoscopy.  Last exam was normal 10 years.  He denies abdominal pain change in bowel habits or rectal bleeding. Family history is negative for CRC. Last aspirin dose was over a week ago.  Past Medical History:  Diagnosis Date  . Arthritis   . Cancer (HCC)    hx of skin cancer on nose   . Hypertension   . PONV (postoperative nausea and vomiting)     Past Surgical History:  Procedure Laterality Date  . arthroscopy Right    x2  . FOREIGN BODY REMOVAL Right 04/08/2014   Procedure: REMOVAL LOOSE BODY MEDIAL SIDE RIGHT KNEE;  Surgeon: Sanjuana Kava, MD;  Location: [facility];  Service: Orthopedics;  Laterality: Right;  . KNEE ARTHROSCOPY Right 04/08/2014   Procedure: ARTHROSCOPY RIGHT KNEE, PARTIAL MEDIAL MENISECTOMY;  Surgeon: Sanjuana Kava, MD;  Location: [facility];  Service: Orthopedics;  Laterality: Right;  . ROTATOR CUFF REPAIR Right    x3  . ROTATOR CUFF REPAIR Left   . SEPTOPLASTY    . skin cancer surgery on nose     . TOTAL KNEE ARTHROPLASTY Right 11/23/2015   Procedure: RIGHT TOTAL KNEE ARTHROPLASTY;  Surgeon: Gaynelle Arabian, MD;  Location: [facility];  Service: Orthopedics;  Laterality: Right;    History reviewed. No pertinent family history. Social History:  reports that he has never smoked. He has never used smokeless tobacco. He reports that he does not drink alcohol and does not use drugs.  Allergies:  Allergies  Allergen Reactions  . Dexamethasone Other (See Comments)    hiccups  . Hydrocodone Itching    Medications Prior to Admission  Medication Sig Dispense Refill  . ascorbic acid (VITAMIN C) 500 MG tablet Take 500 mg by mouth daily.    Marland Kitchen aspirin EC 81 MG tablet Take 81 mg by mouth daily. Swallow whole.    . Aspirin-Salicylamide-Caffeine (BC HEADACHE PO) Take 1 packet by mouth daily as needed (headaches).    .  Carboxymethylcellul-Glycerin (LUBRICATING EYE DROPS OP) Place 1 drop into both eyes daily.    Marland Kitchen KRILL OIL PO Take 2,000 mg by mouth 2 (two) times daily.    Marland Kitchen loratadine (CLARITIN) 10 MG tablet Take 10 mg by mouth daily.    Marland Kitchen olmesartan (BENICAR) 20 MG tablet Take 20 mg by mouth at bedtime.     Marland Kitchen zinc gluconate 50 MG tablet Take 50 mg by mouth daily.      Results for orders placed or performed during the hospital encounter of 01/08/20 (from the past 48 hour(s))  SARS CORONAVIRUS 2 (TAT 6-24 HRS) Nasopharyngeal Nasopharyngeal Swab     Status: None   Collection Time: 01/08/20  8:18 AM   Specimen: Nasopharyngeal Swab  Result Value Ref Range   SARS Coronavirus 2 NEGATIVE NEGATIVE    Comment: (NOTE) SARS-CoV-2 target nucleic acids are NOT DETECTED.  The SARS-CoV-2 RNA is generally detectable in upper and lower respiratory specimens during the acute phase of infection. Negative results do not preclude SARS-CoV-2 infection, do not rule out co-infections with other pathogens, and should not be used as the sole basis for treatment or other patient management decisions. Negative results must be combined with clinical observations, patient history, and epidemiological information. The expected result is Negative.  Fact Sheet for Patients: SugarRoll.be  Fact Sheet for Healthcare Providers: https://www.woods-mathews.com/  This test is not  yet approved or cleared by the [place] and  has been authorized for detection and/or diagnosis of SARS-CoV-2 by FDA under an Emergency Use Authorization (EUA). This EUA will remain  in effect (meaning this test can be used) for the duration of the COVID-19 declaration under Se ction 564(b)(1) of the Act, 21 U.S.C. section 360bbb-3(b)(1), unless the authorization is terminated or revoked sooner.  Performed at [facility], [place] [address] [place], [place] [address]    No results found.  Review of  Systems  Blood pressure 121/74, pulse (!) 56, temperature 97.8 F (36.6 C), temperature source Oral, resp. rate 13, height 5\' 9"  (1.753 m), SpO2 99 %. Physical Exam HENT:     Mouth/Throat:     Mouth: Mucous membranes are moist.     Pharynx: Oropharynx is clear.  Eyes:     General: No scleral icterus.    Conjunctiva/sclera: Conjunctivae normal.  Cardiovascular:     Rate and Rhythm: Normal rate and regular rhythm.     Heart sounds: Normal heart sounds. No murmur heard.   Pulmonary:     Effort: Pulmonary effort is normal.     Breath sounds: Normal breath sounds.  Abdominal:     General: There is no distension.     Palpations: Abdomen is soft. There is no mass.     Tenderness: There is no abdominal tenderness.  Musculoskeletal:        General: No swelling.     Cervical back: Neck supple.  Lymphadenopathy:     Cervical: No cervical adenopathy.  Skin:    General: Skin is warm and dry.     Comments: Scar over forehead.  Neurological:     Mental Status: He is alert.      Assessment/Plan  Average risk screening colonoscopy.  Hildred Laser, MD 01/09/2020, 9:18 AM

## 2020-01-10 LAB — SURGICAL PATHOLOGY

## 2020-01-13 ENCOUNTER — Encounter (HOSPITAL_COMMUNITY): Payer: Self-pay | Admitting: Internal Medicine

## 2020-02-07 ENCOUNTER — Encounter (INDEPENDENT_AMBULATORY_CARE_PROVIDER_SITE_OTHER): Payer: Self-pay | Admitting: *Deleted
